# Patient Record
Sex: Female | Born: 1935 | Race: White | Hispanic: No | Smoking: Never smoker
Health system: Southern US, Community
[De-identification: ages and names within clinical notes are randomized; demographics above are authoritative.]

## PROBLEM LIST (undated history)

## (undated) DIAGNOSIS — I10 Essential (primary) hypertension: Secondary | ICD-10-CM

## (undated) DIAGNOSIS — E7800 Pure hypercholesterolemia, unspecified: Secondary | ICD-10-CM

## (undated) DIAGNOSIS — E079 Disorder of thyroid, unspecified: Secondary | ICD-10-CM

## (undated) DIAGNOSIS — F329 Major depressive disorder, single episode, unspecified: Secondary | ICD-10-CM

## (undated) DIAGNOSIS — F32A Depression, unspecified: Secondary | ICD-10-CM

---

## 2017-10-04 ENCOUNTER — Ambulatory Visit (HOSPITAL_COMMUNITY)
Admission: EM | Admit: 2017-10-04 | Discharge: 2017-10-04 | Disposition: A | Discharge status: Home / Self Care | Payer: Medicare FFS | Attending: Internal Medicine | Admitting: Internal Medicine

## 2017-10-04 ENCOUNTER — Encounter (HOSPITAL_COMMUNITY): Payer: Self-pay | Admitting: Family Medicine

## 2017-10-04 ENCOUNTER — Ambulatory Visit (INDEPENDENT_AMBULATORY_CARE_PROVIDER_SITE_OTHER): Payer: Medicare FFS

## 2017-10-04 DIAGNOSIS — H10021 Other mucopurulent conjunctivitis, right eye: Secondary | ICD-10-CM | POA: Diagnosis not present

## 2017-10-04 DIAGNOSIS — J189 Pneumonia, unspecified organism: Secondary | ICD-10-CM

## 2017-10-04 DIAGNOSIS — E876 Hypokalemia: Secondary | ICD-10-CM

## 2017-10-04 DIAGNOSIS — J181 Lobar pneumonia, unspecified organism: Secondary | ICD-10-CM

## 2017-10-04 DIAGNOSIS — H1033 Unspecified acute conjunctivitis, bilateral: Secondary | ICD-10-CM

## 2017-10-04 DIAGNOSIS — R05 Cough: Secondary | ICD-10-CM

## 2017-10-04 HISTORY — DX: Depression, unspecified: F32.A

## 2017-10-04 HISTORY — DX: Disorder of thyroid, unspecified: E07.9

## 2017-10-04 HISTORY — DX: Pure hypercholesterolemia, unspecified: E78.00

## 2017-10-04 HISTORY — DX: Major depressive disorder, single episode, unspecified: F32.9

## 2017-10-04 HISTORY — DX: Essential (primary) hypertension: I10

## 2017-10-04 LAB — POCT I-STAT, CHEM 8
BUN: 13 mg/dL (ref 6–20)
CHLORIDE: 100 mmol/L — AB (ref 101–111)
CREATININE: 0.4 mg/dL — AB (ref 0.44–1.00)
Calcium, Ion: 1.09 mmol/L — ABNORMAL LOW (ref 1.15–1.40)
Glucose, Bld: 159 mg/dL — ABNORMAL HIGH (ref 65–99)
HEMATOCRIT: 39 % (ref 36.0–46.0)
Hemoglobin: 13.3 g/dL (ref 12.0–15.0)
Potassium: 2.8 mmol/L — ABNORMAL LOW (ref 3.5–5.1)
SODIUM: 140 mmol/L (ref 135–145)
TCO2: 28 mmol/L (ref 22–32)

## 2017-10-04 MED ORDER — POTASSIUM CHLORIDE CRYS ER 20 MEQ PO TBCR: 20.0000 meq | EXTENDED_RELEASE_TABLET | Freq: Every day | ORAL | 0 refills | Status: DC

## 2017-10-04 MED ORDER — POLYMYXIN B-TRIMETHOPRIM 10000-0.1 UNIT/ML-% OP SOLN: 1.0000 [drp] | OPHTHALMIC | 0 refills | Status: AC

## 2017-10-04 MED ORDER — LIDOCAINE HCL (PF) 1 % IJ SOLN
INTRAMUSCULAR | Status: AC
  Filled 2017-10-04: qty 2

## 2017-10-04 MED ORDER — LEVOFLOXACIN 750 MG PO TABS: 750.0000 mg | ORAL_TABLET | Freq: Every day | ORAL | 0 refills | Status: AC

## 2017-10-04 MED ORDER — CEFTRIAXONE SODIUM 1 G IJ SOLR
INTRAMUSCULAR | Status: AC
  Filled 2017-10-04: qty 10

## 2017-10-04 MED ORDER — CEFTRIAXONE SODIUM 1 G IJ SOLR
1.0000 g | Freq: Once | INTRAMUSCULAR | Status: AC
  Administered 2017-10-04: 1 g via INTRAMUSCULAR

## 2017-10-04 MED ORDER — LIDOCAINE HCL (PF) 2 % IJ SOLN
INTRAMUSCULAR | Status: AC
  Filled 2017-10-04: qty 2

## 2017-10-04 MED ORDER — POTASSIUM CHLORIDE CRYS ER 20 MEQ PO TBCR: 40.0000 meq | EXTENDED_RELEASE_TABLET | Freq: Every day | ORAL | 0 refills | Status: DC

## 2017-10-04 NOTE — ED Provider Notes (Signed)
[facility]    CSN: [id number] Arrival date & time: 10/04/17  1020     History   Chief Complaint Chief Complaint  Patient presents with  . Cough  . Wheezing    HPI Kaitlin Shelton is a 83 y.o. female.   Kaitlin Shelton presents with her daughters with complaints of cough, wheezing and shortness of breath which has worsened over the past 4-5 days. Productive cough. No known fevers. Without change in mental status. Grandson had ear infection otherwise without other known ill contacts. Without nausea, vomiting or diarrhea. She is here visiting from out of state. Without kidney or cardiac history. History of bronchitis, CVA, aneurysm, left shoulder fracture and left knee pain. Is returning home in two days. Does have a primary care provider. Without chest pain, ear or throat pain.    ROS per HPI.       Past Medical History:  Diagnosis Date  . Depression   . High cholesterol   . Hypertension   . Thyroid disease     There are no active problems to display for this patient.   History reviewed. No pertinent surgical history.  OB History    No data available       Home Medications    Prior to Admission medications   Medication Sig Start Date End Date Taking? Authorizing Provider  aspirin EC 81 MG tablet Take 81 mg by mouth daily.   Yes [provider]  diltiazem (CARDIZEM) 120 MG tablet Take 120 mg by mouth 4 (four) times daily.   Yes [provider]  DULoxetine (CYMBALTA) 30 MG capsule Take 30 mg by mouth daily.   Yes [provider]  folic acid (FOLVITE) 1 MG tablet Take 1 mg by mouth daily.   Yes [provider]  hydrochlorothiazide (HYDRODIURIL) 12.5 MG tablet Take 12.5 mg by mouth daily.   Yes [provider]  levothyroxine (SYNTHROID, LEVOTHROID) 125 MCG tablet Take 125 mcg by mouth daily before breakfast.   Yes [provider]  omeprazole (PRILOSEC) 20 MG capsule Take 20 mg by mouth daily.   Yes  [provider]  pravastatin (PRAVACHOL) 20 MG tablet Take 20 mg by mouth daily.   Yes [provider]  levofloxacin (LEVAQUIN) 750 MG tablet Take 1 tablet (750 mg total) by mouth daily for 5 days. 10/04/17 10/09/17  Georgetta Haber, NP  potassium chloride SA (K-DUR,KLOR-CON) 20 MEQ tablet Take 1 tablet (20 mEq total) by mouth daily for 5 doses. 10/04/17 10/09/17  Georgetta Haber, NP  trimethoprim-polymyxin b (POLYTRIM) ophthalmic solution Place 1 drop into the right eye every 4 (four) hours for 5 days. 10/04/17 10/09/17  Georgetta Haber, NP    Family History History reviewed. No pertinent family history.  Social History Social History   Tobacco Use  . Smoking status: Never Smoker  . Smokeless tobacco: Never Used  Substance Use Topics  . Alcohol use: Not on file  . Drug use: Not on file     Allergies   Codeine   Review of Systems Review of Systems   Physical Exam Triage Vital Signs ED Triage Vitals [10/04/17 1056]  Enc Vitals Group     BP (!) 146/85     Pulse Rate (!) 110     Resp (!) 26     Temp 98.8 F (37.1 C)     Temp src      SpO2 95 %     Weight  Height      Head Circumference      Peak Flow      Pain Score      Pain Loc      Pain Edu?      Excl. in GC?    No data found.  Updated Vital Signs BP (!) 146/85   Pulse (!) 110   Temp 98.8 F (37.1 C)   Resp (!) 26   SpO2 95%   Visual Acuity Right Eye Distance:   Left Eye Distance:   Bilateral Distance:    Right Eye Near:   Left Eye Near:    Bilateral Near:     Physical Exam  Constitutional: She is oriented to person, place, and time. She appears well-developed and well-nourished. No distress.  HENT:  Head: Normocephalic and atraumatic.  Right Ear: Hearing, tympanic membrane, external ear and ear canal normal.  Left Ear: Hearing, tympanic membrane, external ear and ear canal normal.  Nose: Nose normal.  Mouth/Throat: Uvula is midline, oropharynx is clear and moist and  mucous membranes are normal.  Eyes: Right eye exhibits discharge. Left eye exhibits discharge.  Mild bilateral eye redness  Cardiovascular: Normal rate, regular rhythm and normal heart sounds.  Pulmonary/Chest: Effort normal. No accessory muscle usage. Tachypnea noted. No apnea. She has decreased breath sounds in the right upper field and the right lower field.  Neurological: She is alert and oriented to person, place, and time.  Skin: Skin is warm and dry.     UC Treatments / Results  Labs (all labs ordered are listed, but only abnormal results are displayed) Labs Reviewed  POCT I-STAT, CHEM 8 - Abnormal; Notable for the following components:      Result Value   Potassium 2.8 (*)    Chloride 100 (*)    Creatinine, Ser 0.40 (*)    Glucose, Bld 159 (*)    Calcium, Ion 1.09 (*)    All other components within normal limits    EKG  EKG Interpretation None       Radiology Dg Chest 2 View  Result Date: 10/04/2017 CLINICAL DATA:  Cough, shortness of breath EXAM: CHEST  2 VIEW COMPARISON:  None. FINDINGS: Large hiatal hernia.  Associated bilateral lower lobe atelectasis. Possible mild patchy right upper lobe opacity, pneumonia not excluded. No pleural effusion or pneumothorax. Mild degenerative changes of the visualized thoracolumbar spine. IMPRESSION: Possible mild patchy right upper lobe opacity, pneumonia not excluded. Consider follow-up chest radiographs in 4-6 weeks. Large hiatal hernia with bilateral lower lobe atelectasis. Electronically Signed   By: Charline BillsSriyesh  Krishnan M.D.   On: 10/04/2017 11:41    Procedures Procedures (including critical care time)  Medications Ordered in UC Medications  cefTRIAXone (ROCEPHIN) injection 1 g (not administered)     Initial Impression / Assessment and Plan / UC Course  I have reviewed the triage vital signs and the nursing notes.  Pertinent labs & imaging results that were available during my care of the patient were reviewed by me and  considered in my medical decision making (see chart for details).  Clinical Course as of Oct 04 1217  Fri Oct 04, 2017  1148 DG Chest 2 View [NB]    Clinical Course User Index [NB] Georgetta HaberBurky, Natalie B, NP    Noted patient vitals signs. BUN Wnl at this time. Without change in mental status. Patient is staying with family with close observation. Discussed return precautions at length. Potassium replacement provided, with recheck recommended with PCP, daughters thought  she would be able to be reseen on 12/31 with her PCP. Rocephin in clinic and levaquin provided today. Emphasized return precautions and recheck with PCP to ensure improvement. Patient and daughters verbalized understanding and agreeable to plan.    Final Clinical Impressions(s) / UC Diagnoses   Final diagnoses:  Community acquired pneumonia of right upper lobe of lung (HCC)  Acute conjunctivitis of both eyes, unspecified acute conjunctivitis type  Hypokalemia    ED Discharge Orders        Ordered    trimethoprim-polymyxin b (POLYTRIM) ophthalmic solution  Every 4 hours     10/04/17 1149    levofloxacin (LEVAQUIN) 750 MG tablet  Daily     10/04/17 1214    potassium chloride SA (K-DUR,KLOR-CON) 20 MEQ tablet  Daily,   Status:  Discontinued     10/04/17 1214    potassium chloride SA (K-DUR,KLOR-CON) 20 MEQ tablet  Daily    Comments:  Please disregard previous order for 40meq daily   10/04/17 1216       Controlled Substance Prescriptions [place] Controlled Substance Registry consulted? Not Applicable   Georgetta HaberBurky, Natalie B, NP 10/04/17 1221

## 2017-10-04 NOTE — ED Triage Notes (Signed)
Pt here for 4 days of cough and wheezing with SOB.

## 2017-10-04 NOTE — Discharge Instructions (Signed)
12/28 1158 Sodium 140  Potassium 2.8  Chloride 100  BUN 13  Creatinine 0.40  Glucose 159  Calcium Ionized 1.09  TCO2 28  Hemoglobin 13.3  HCT 39.0  CLINICAL DATA:  Cough, shortness of breath   EXAM: CHEST  2 VIEW   COMPARISON:  None.   FINDINGS: Large hiatal hernia.  Associated bilateral lower lobe atelectasis.   Possible mild patchy right upper lobe opacity, pneumonia not excluded.   No pleural effusion or pneumothorax.   Mild degenerative changes of the visualized thoracolumbar spine.   IMPRESSION: Possible mild patchy right upper lobe opacity, pneumonia not excluded. Consider follow-up chest radiographs in 4-6 weeks.   Large hiatal hernia with bilateral lower lobe atelectasis.     Electronically Signed   By: Charline BillsSriyesh  Krishnan M.D.   On: 10/04/2017 11:41\  Please take daily potassium and recheck with primary care doctor in the next week. Complete course of antibiotics for pneumonia. Small frequent sips of water to ensure adequate hydration. Eye drops to bilateral eyes.  If develop change in mentation, more fatigue, confusion, weakness, fevers develop, or otherwise worsening please go to the er for further treatment. Please recheck with your pcp to ensure improving on Monday when you arrive home or at soonest available appointment.

## 2021-03-01 ENCOUNTER — Other Ambulatory Visit: Payer: Self-pay

## 2021-03-01 ENCOUNTER — Encounter: Payer: Self-pay | Admitting: Pulmonary Disease

## 2021-03-01 ENCOUNTER — Ambulatory Visit (INDEPENDENT_AMBULATORY_CARE_PROVIDER_SITE_OTHER): Payer: Medicare Other | Admitting: Pulmonary Disease

## 2021-03-01 ENCOUNTER — Ambulatory Visit (INDEPENDENT_AMBULATORY_CARE_PROVIDER_SITE_OTHER): Payer: Medicare Other

## 2021-03-01 VITALS — BP 122/74 | HR 73 | Temp 97.3°F | Ht 64.0 in | Wt 196.0 lb

## 2021-03-01 DIAGNOSIS — R059 Cough, unspecified: Secondary | ICD-10-CM | POA: Diagnosis not present

## 2021-03-01 DIAGNOSIS — R0602 Shortness of breath: Secondary | ICD-10-CM | POA: Diagnosis not present

## 2021-03-01 NOTE — Patient Instructions (Signed)
Continue using oxygen supplementation at night  Chest x-ray today  Nebulization treatment Inhalers as needed  Graded exercises as tolerated  Call with any significant concerns  I will see you back in 4 months

## 2021-03-01 NOTE — Progress Notes (Signed)
Kaitlin Shelton    [id number]    1929-07-07  Primary Care Physician:Pcp, No  Referring Physician: No referring provider defined for this encounter.  Chief complaint:   Patient being seen for shortness of breath  HPI: Accompanied by her daughter Recently moved to [place] Lives in assisted living  Has a past history of obstructive sleep apnea Not able to tolerate CPAP therapy She uses oxygen supplementation 2 to 4 L at night  Shortness of breath on exertion No cough No chest pains or chest discomfort  Has a history of hiatal hernia Stroke 12 years ago History of dementia  Patient has no complaints today, states she feels well  Outpatient Encounter Medications as of 03/01/2021  Medication Sig  . albuterol (VENTOLIN HFA) 108 (90 Base) MCG/ACT inhaler ProAir HFA 90 mcg/actuation aerosol inhaler  . arformoterol (BROVANA) 15 MCG/2ML NEBU Brovana 15 mcg/2 mL solution for nebulization  Inhale 2 mL twice a day by inhalation route for 30 days.  Marland Kitchen aspirin EC 81 MG tablet Take 81 mg by mouth daily.  . benzonatate (TESSALON) 100 MG capsule benzonatate 100 mg capsule  . diltiazem (CARDIZEM CD) 180 MG 24 hr capsule Cartia XT 180 mg capsule,extended release  . diltiazem (CARDIZEM) 120 MG tablet Take 120 mg by mouth 4 (four) times daily.  . DULoxetine (CYMBALTA) 30 MG capsule Take 30 mg by mouth daily.  . folic acid (FOLVITE) 1 MG tablet Take 1 mg by mouth daily.  . hydrochlorothiazide (HYDRODIURIL) 12.5 MG tablet Take 12.5 mg by mouth daily.  Marland Kitchen ipratropium-albuterol (DUONEB) 0.5-2.5 (3) MG/3ML SOLN ipratropium 0.5 mg-albuterol 3 mg (2.5 mg base)/3 mL nebulization soln  USE 1 VIAL IN NEBULIZER EVERY 4 TO 6 HOURS AS NEEDED  . levothyroxine (SYNTHROID, LEVOTHROID) 125 MCG tablet Take 125 mcg by mouth daily before breakfast.  . omeprazole (PRILOSEC) 20 MG capsule Take 20 mg by mouth daily.  . pravastatin (PRAVACHOL) 20 MG tablet Take 20 mg by mouth daily.  . predniSONE  (DELTASONE) 20 MG tablet prednisone 20 mg tablet  . [DISCONTINUED] potassium chloride SA (K-DUR,KLOR-CON) 20 MEQ tablet Take 1 tablet (20 mEq total) by mouth daily for 5 doses.   No facility-administered encounter medications on file as of 03/01/2021.    Allergies as of 03/01/2021 - Review Complete 03/01/2021  Allergen Reaction Noted  . Codeine  10/04/2017    Past Medical History:  Diagnosis Date  . Depression   . High cholesterol   . Hypertension   . Thyroid disease     History reviewed. No pertinent surgical history.  History reviewed. No pertinent family history.  Social History   Socioeconomic History  . Marital status: Widowed    Spouse name: Not on file  . Number of children: Not on file  . Years of education: Not on file  . Highest education level: Not on file  Occupational History  . Not on file  Tobacco Use  . Smoking status: Never Smoker  . Smokeless tobacco: Never Used  Substance and Sexual Activity  . Alcohol use: Not on file  . Drug use: Not on file  . Sexual activity: Not on file  Other Topics Concern  . Not on file  Social History Narrative  . Not on file   Social Determinants of Health   Financial Resource Strain: Not on file  Food Insecurity: Not on file  Transportation Needs: Not on file  Physical Activity: Not on file  Stress: Not on  file  Social Connections: Not on file  Intimate Partner Violence: Not on file    Review of Systems  Respiratory: Negative for cough and shortness of breath.   Psychiatric/Behavioral: Negative for sleep disturbance.    Vitals:   03/01/21 1518  BP: 122/74  Pulse: 73  Temp: (!) 97.3 F (36.3 C)  SpO2: 94%     Physical Exam Constitutional:      Appearance: She is obese.  HENT:     Head: Normocephalic and atraumatic.     Mouth/Throat:     Mouth: Mucous membranes are moist.  Eyes:     General:        Right eye: No discharge.        Left eye: No discharge.  Cardiovascular:     Rate and Rhythm:  Normal rate and regular rhythm.     Heart sounds: No murmur heard. No friction rub.  Pulmonary:     Effort: No respiratory distress.     Breath sounds: No stridor. No wheezing or rhonchi.     Comments: Decreased air movement at the left base Musculoskeletal:     Cervical back: No rigidity or tenderness.  Neurological:     Mental Status: She is alert.  Psychiatric:        Mood and Affect: Mood normal.     Assessment:  History of obstructive sleep apnea -Only able to tolerate oxygen supplementation at night -On 2 to 4 L of oxygen at night  Shortness of breath -She does have a nebulizer that she uses as needed -MDI as needed  Plan/Recommendations: Obtain chest x-ray today  Continue bronchodilators as needed  Encouraged to call as needed  I will see her back in about 4 months  I did review the results chest x-ray performed today showing a large hiatal hernia, no acute infiltrative process in the lungs    Virl Diamond MD [facility] 03/01/2021, 3:25 PM  CC: No ref. provider found

## 2021-03-23 ENCOUNTER — Encounter (HOSPITAL_COMMUNITY): Payer: Self-pay | Admitting: Emergency Medicine

## 2021-03-23 ENCOUNTER — Other Ambulatory Visit: Payer: Self-pay

## 2021-03-23 ENCOUNTER — Emergency Department (HOSPITAL_COMMUNITY)
Admission: EM | Admit: 2021-03-23 | Discharge: 2021-03-23 | Disposition: A | Discharge status: Home / Self Care | Payer: Medicare Other | Attending: Emergency Medicine | Admitting: Emergency Medicine

## 2021-03-23 ENCOUNTER — Emergency Department (HOSPITAL_COMMUNITY): Payer: Medicare Other

## 2021-03-23 DIAGNOSIS — S32020A Wedge compression fracture of second lumbar vertebra, initial encounter for closed fracture: Secondary | ICD-10-CM | POA: Diagnosis not present

## 2021-03-23 DIAGNOSIS — W19XXXA Unspecified fall, initial encounter: Secondary | ICD-10-CM | POA: Insufficient documentation

## 2021-03-23 DIAGNOSIS — Z7982 Long term (current) use of aspirin: Secondary | ICD-10-CM | POA: Diagnosis not present

## 2021-03-23 DIAGNOSIS — F0390 Unspecified dementia without behavioral disturbance: Secondary | ICD-10-CM | POA: Insufficient documentation

## 2021-03-23 DIAGNOSIS — T1490XA Injury, unspecified, initial encounter: Secondary | ICD-10-CM | POA: Diagnosis present

## 2021-03-23 DIAGNOSIS — I4891 Unspecified atrial fibrillation: Secondary | ICD-10-CM

## 2021-03-23 DIAGNOSIS — Z79899 Other long term (current) drug therapy: Secondary | ICD-10-CM | POA: Diagnosis not present

## 2021-03-23 DIAGNOSIS — M25512 Pain in left shoulder: Secondary | ICD-10-CM | POA: Insufficient documentation

## 2021-03-23 DIAGNOSIS — I10 Essential (primary) hypertension: Secondary | ICD-10-CM | POA: Diagnosis not present

## 2021-03-23 LAB — CBC WITH DIFFERENTIAL/PLATELET
Abs Immature Granulocytes: 0.06 10*3/uL (ref 0.00–0.07)
Basophils Absolute: 0 10*3/uL (ref 0.0–0.1)
Basophils Relative: 0 %
Eosinophils Absolute: 0 10*3/uL (ref 0.0–0.5)
Eosinophils Relative: 0 %
HCT: 38.6 % (ref 36.0–46.0)
Hemoglobin: 12.3 g/dL (ref 12.0–15.0)
Immature Granulocytes: 1 %
Lymphocytes Relative: 8 %
Lymphs Abs: 0.6 10*3/uL — ABNORMAL LOW (ref 0.7–4.0)
MCH: 31.5 pg (ref 26.0–34.0)
MCHC: 31.9 g/dL (ref 30.0–36.0)
MCV: 99 fL (ref 80.0–100.0)
Monocytes Absolute: 0.6 10*3/uL (ref 0.1–1.0)
Monocytes Relative: 8 %
Neutro Abs: 6.9 10*3/uL (ref 1.7–7.7)
Neutrophils Relative %: 83 %
Platelets: 159 10*3/uL (ref 150–400)
RBC: 3.9 MIL/uL (ref 3.87–5.11)
RDW: 13.8 % (ref 11.5–15.5)
WBC: 8.3 10*3/uL (ref 4.0–10.5)
nRBC: 0 % (ref 0.0–0.2)

## 2021-03-23 LAB — BASIC METABOLIC PANEL
Anion gap: 9 (ref 5–15)
BUN: 16 mg/dL (ref 8–23)
CO2: 29 mmol/L (ref 22–32)
Calcium: 8.9 mg/dL (ref 8.9–10.3)
Chloride: 103 mmol/L (ref 98–111)
Creatinine, Ser: 0.49 mg/dL (ref 0.44–1.00)
GFR, Estimated: >60 mL/min (ref >60)
Glucose, Bld: 124 mg/dL — ABNORMAL HIGH (ref 70–99)
Potassium: 3.7 mmol/L (ref 3.5–5.1)
Sodium: 141 mmol/L (ref 135–145)

## 2021-03-23 MED ORDER — ACETAMINOPHEN 500 MG PO TABS
1000.0000 mg | ORAL_TABLET | Freq: Once | ORAL | Status: AC
  Administered 2021-03-23: 1000 mg via ORAL
  Filled 2021-03-23: qty 2

## 2021-03-23 MED ORDER — DILTIAZEM HCL ER COATED BEADS 180 MG PO CP24
180.0000 mg | ORAL_CAPSULE | Freq: Once | ORAL | Status: AC
  Administered 2021-03-23: 180 mg via ORAL
  Filled 2021-03-23: qty 1

## 2021-03-23 MED ORDER — DILTIAZEM HCL 25 MG/5ML IV SOLN
20.0000 mg | Freq: Once | INTRAVENOUS | Status: AC
  Administered 2021-03-23: 20 mg via INTRAVENOUS
  Filled 2021-03-23: qty 5

## 2021-03-23 MED ORDER — NYSTATIN 100000 UNIT/GM EX CREA
TOPICAL_CREAM | Freq: Once | CUTANEOUS | Status: AC
  Filled 2021-03-23: qty 15

## 2021-03-23 NOTE — ED Notes (Signed)
Pt in CT.

## 2021-03-23 NOTE — ED Provider Notes (Signed)
[facility]-EMERGENCY DEPT Provider Note   CSN: [id number] Arrival date & time: 03/23/21  1002     History Chief Complaint  Patient presents with   Kaitlin Shelton is a 87 y.o. female.  Pt presents to the ED today from [facility].  She had an unwitnessed fall in her room.  She does not complain of any pain to me, but complained of left shoulder pain to EMS.  This pain is chronic per EMS report.  Pt is not on a blood thinner.  She has dementia and is unable to give any hx.   Pt noted to be in afib with RVR.  She is supposed to be on diltiazem, but has not had any of her meds today.  Daughter said she is not supposed to walk on her own.  She walks very little.  Most of the time she is transported via wheelchair, but she has a walker.      Past Medical History:  Diagnosis Date   Depression    High cholesterol    Hypertension    Thyroid disease     There are no problems to display for this patient.   No past surgical history on file.   OB History   No obstetric history on file.     No family history on file.  Social History   Tobacco Use   Smoking status: Never   Smokeless tobacco: Never    Home Medications Prior to Admission medications   Medication Sig Start Date End Date Taking? Authorizing Provider  albuterol (VENTOLIN HFA) 108 (90 Base) MCG/ACT inhaler ProAir HFA 90 mcg/actuation aerosol inhaler    [provider]  arformoterol (BROVANA) 15 MCG/2ML NEBU Brovana 15 mcg/2 mL solution for nebulization  Inhale 2 mL twice a day by inhalation route for 30 days.    [provider]  aspirin EC 81 MG tablet Take 81 mg by mouth daily.    [provider]  benzonatate (TESSALON) 100 MG capsule benzonatate 100 mg capsule    [provider]  diltiazem (CARDIZEM CD) 180 MG 24 hr capsule Cartia XT 180 mg capsule,extended release 04/25/20   [provider]  diltiazem (CARDIZEM) 120 MG  tablet Take 120 mg by mouth 4 (four) times daily.    [provider]  DULoxetine (CYMBALTA) 30 MG capsule Take 30 mg by mouth daily.    [provider]  folic acid (FOLVITE) 1 MG tablet Take 1 mg by mouth daily.    [provider]  hydrochlorothiazide (HYDRODIURIL) 12.5 MG tablet Take 12.5 mg by mouth daily.    [provider]  ipratropium-albuterol (DUONEB) 0.5-2.5 (3) MG/3ML SOLN ipratropium 0.5 mg-albuterol 3 mg (2.5 mg base)/3 mL nebulization soln  USE 1 VIAL IN NEBULIZER EVERY 4 TO 6 HOURS AS NEEDED    [provider]  levothyroxine (SYNTHROID, LEVOTHROID) 125 MCG tablet Take 125 mcg by mouth daily before breakfast.    [provider]  omeprazole (PRILOSEC) 20 MG capsule Take 20 mg by mouth daily.    [provider]  pravastatin (PRAVACHOL) 20 MG tablet Take 20 mg by mouth daily.    [provider]  predniSONE (DELTASONE) 20 MG tablet prednisone 20 mg tablet    [provider]    Allergies    Codeine  Review of Systems   Review of Systems  Unable to perform ROS: Dementia  All other systems reviewed and are negative.  Physical  Exam Updated Vital Signs BP 123/64   Pulse 85   Temp 97.8 F (36.6 C) (Oral)   Resp 19   Ht 5\' 4"  (1.626 m)   Wt 88.9 kg   SpO2 93%   BMI 33.64 kg/m   Physical Exam Vitals and nursing note reviewed.  Constitutional:      Appearance: Normal appearance.  HENT:     Head: Normocephalic and atraumatic.     Right Ear: External ear normal.     Left Ear: External ear normal.     Nose: Nose normal.     Mouth/Throat:     Mouth: Mucous membranes are moist.     Pharynx: Oropharynx is clear.  Eyes:     Extraocular Movements: Extraocular movements intact.     Conjunctiva/sclera: Conjunctivae normal.     Pupils: Pupils are equal, round, and reactive to light.  Cardiovascular:     Rate and Rhythm: Tachycardia present. Rhythm irregular.     Pulses: Normal pulses.     Heart  sounds: Normal heart sounds.  Pulmonary:     Effort: Pulmonary effort is normal.     Breath sounds: Normal breath sounds.  Abdominal:     General: Abdomen is flat. Bowel sounds are normal.     Palpations: Abdomen is soft.  Musculoskeletal:        General: Normal range of motion.     Cervical back: Normal range of motion and neck supple.  Skin:    Capillary Refill: Capillary refill takes less than 2 seconds.     Comments: Intertrigo right groin  Neurological:     General: No focal deficit present.     Mental Status: She is alert. Mental status is at baseline.  Psychiatric:        Mood and Affect: Mood normal.   ED Results / Procedures / Treatments   Labs (all labs ordered are listed, but only abnormal results are displayed) Labs Reviewed  BASIC METABOLIC PANEL - Abnormal; Notable for the following components:      Result Value   Glucose, Bld 124 (*)    All other components within normal limits  CBC WITH DIFFERENTIAL/PLATELET - Abnormal; Notable for the following components:   Lymphs Abs 0.6 (*)    All other components within normal limits    EKG EKG Interpretation  Date/Time:  Thursday March 23 2021 10:18:28 EDT Ventricular Rate:  127 PR Interval:  142 QRS Duration: 83 QT Interval:  334 QTC Calculation: 490 R Axis:   75 Text Interpretation: Ectopic atrial tachycardia, unifocal Abnormal R-wave progression, early transition Inferior infarct, old No old tracing to compare Confirmed by [id number] [phone]) on 03/23/2021 11:17:33 AM  Radiology No results found.  Procedures Procedures   Medications Ordered in ED Medications  nystatin cream (MYCOSTATIN) ( Topical Given 03/23/21 1124)  diltiazem (CARDIZEM CD) 24 hr capsule 180 mg (180 mg Oral Given 03/23/21 1232)  diltiazem (CARDIZEM) injection 20 mg (20 mg Intravenous Given 03/23/21 1528)  acetaminophen (TYLENOL) tablet 1,000 mg (1,000 mg Oral Given 03/23/21 1454)    ED Course  I have reviewed the triage vital signs  and the nursing notes.  Pertinent labs & imaging results that were available during my care of the patient were reviewed by me and considered in my medical decision making (see chart for details).    MDM Rules/Calculators/A&P  HR is down with cardizem.  Pt has afib, but is not on blood thinners.  Likely due to fall risk.  CHA2DS2/VAS Stroke Risk Points     51 (age, sex, htn, cva)    This score determines the patient's risk of having a stroke if the  patient has atrial fibrillation.      This score is not applicable to this patient. Components are not  calculated.      Pt's daughter verifies the fx seen (Pelvis and Arm) were old.  Nurses tried to get her up and she complained of back pain.   Urine still pending. Pt signed out to Dr. Criss Alvine at shift change.  Final Clinical Impression(s) / ED Diagnoses Final diagnoses:  Fall, initial encounter  Atrial fibrillation with RVR (HCC)  Closed compression fracture of L2 lumbar vertebra, initial encounter [facility])    Rx / DC Orders ED Discharge Orders     None        Jacalyn Lefevre, MD 03/27/21 [phone]

## 2021-03-23 NOTE — ED Notes (Signed)
Pt in CT at this time.

## 2021-03-23 NOTE — ED Triage Notes (Signed)
Pt arrived by [facility] from [place] [place] with c/o unwitnessed fall in room. Pt does complain of left shoulder pain, however daughter reports the left arm pain is not anything new. Pt has hx of dementia unable to provide other details. Denies use of blood thinner.   Vitals 126/76 HR - 80 RR- 22 95 % 2LNC CBG 135

## 2021-03-23 NOTE — Discharge Instructions (Addendum)
If you develop worsening, recurrent, or continued back pain, numbness or weakness in the legs, incontinence of your bowels or bladders, numbness of your buttocks, fever, abdominal pain, or any other new/concerning symptoms then return to the ER for evaluation.  

## 2021-03-23 NOTE — ED Provider Notes (Signed)
Care transferred to me.  X-ray shows L2 compression fracture of unclear age.  After the Tylenol the patient is able to get up and ambulate with assistance.  This seems to be her baseline.  Discussed with daughter and at this point given it is taken a while for the patient to give a urine sample she would prefer to just have a urine checked at her facility.  I think this is pretty reasonable.  She was tachycardic earlier in the presentation but right now her vitals are normal.  Will discharge home with return precautions.   Pricilla Loveless, MD 03/23/21 1729

## 2021-03-23 NOTE — ED Notes (Signed)
Pt cannot attempt to even get up without having severe pain. Was not able to ambulate.

## 2021-04-12 ENCOUNTER — Telehealth: Payer: Self-pay | Admitting: Pulmonary Disease

## 2021-04-12 DIAGNOSIS — R0602 Shortness of breath: Secondary | ICD-10-CM

## 2021-04-12 NOTE — Telephone Encounter (Signed)
Call returned to patient daughter Olegario Messier, she states her mother moved from [place], [place]. She reports she has been here since March and has been using [facility] [facility] and they now need a local oxygen company. Her concentrator broke and they are willing to get her one until she can get with a new company. Patient is only using the concentrator at night because she has sleep apnea. If they check her oxygen during the day and it is low which is rare per the daughter she will use it during the day. They cannot recall when she had a sleep study but states he has been forever ago. I made her aware I would have to find out more information and give her a call back.   Called made to [facility]. They do not have a sleep study or ONO on file for this patient.   Will send message to MD.   AO please advise if okay to place order for ONO. Patient needs a local oxygen company however previous pulmonary office does not have a recent ONO and qualifying stats for oxygen are 87 years old. Patient only uses oxygen at night. Thanks :)

## 2021-04-14 ENCOUNTER — Ambulatory Visit: Payer: Self-pay

## 2021-04-23 ENCOUNTER — Emergency Department (HOSPITAL_BASED_OUTPATIENT_CLINIC_OR_DEPARTMENT_OTHER): Payer: Medicare Other

## 2021-04-23 ENCOUNTER — Emergency Department (HOSPITAL_BASED_OUTPATIENT_CLINIC_OR_DEPARTMENT_OTHER)
Admission: EM | Admit: 2021-04-23 | Discharge: 2021-04-23 | Disposition: A | Discharge status: Home / Self Care | Payer: Medicare Other | Attending: Emergency Medicine | Admitting: Emergency Medicine

## 2021-04-23 ENCOUNTER — Encounter (HOSPITAL_BASED_OUTPATIENT_CLINIC_OR_DEPARTMENT_OTHER): Payer: Self-pay

## 2021-04-23 DIAGNOSIS — Y9289 Other specified places as the place of occurrence of the external cause: Secondary | ICD-10-CM | POA: Insufficient documentation

## 2021-04-23 DIAGNOSIS — Z79899 Other long term (current) drug therapy: Secondary | ICD-10-CM | POA: Insufficient documentation

## 2021-04-23 DIAGNOSIS — I10 Essential (primary) hypertension: Secondary | ICD-10-CM | POA: Diagnosis not present

## 2021-04-23 DIAGNOSIS — M79602 Pain in left arm: Secondary | ICD-10-CM

## 2021-04-23 DIAGNOSIS — Z7982 Long term (current) use of aspirin: Secondary | ICD-10-CM | POA: Diagnosis not present

## 2021-04-23 DIAGNOSIS — W19XXXA Unspecified fall, initial encounter: Secondary | ICD-10-CM | POA: Diagnosis not present

## 2021-04-23 DIAGNOSIS — F0390 Unspecified dementia without behavioral disturbance: Secondary | ICD-10-CM | POA: Insufficient documentation

## 2021-04-23 NOTE — ED Triage Notes (Signed)
Ss found by staff at [facility] on her floor, having fallen. Pain response elicited at left wrist and shoulder areas (only). She is alert and confused per her normal per staff at the nursing home.

## 2021-04-23 NOTE — ED Provider Notes (Signed)
[facility] EMERGENCY DEPT Provider Note   CSN: [id number] Arrival date & time: 04/23/21  0076     History No chief complaint on file.   Kaitlin Shelton is a 87 y.o. female.  She is brought in by EMS for evaluation after a fall.  Level 5 caveat secondary to dementia.  Patient resident at [facility].  Does not recall fall.  Complaining of left arm pain.  Unknown if any loss of consciousness.  Found on the floor by staff.  Assisted to standing.  Not on blood thinners.  The history is provided by the patient and the EMS personnel.  Fall This is a new problem. The current episode started 1 to 2 hours ago. The problem has not changed since onset.Pertinent negatives include no chest pain, no abdominal pain, no headaches and no shortness of breath. Associated symptoms comments: L arm pain. The symptoms are aggravated by bending and twisting. The symptoms are relieved by position. She has tried nothing for the symptoms. The treatment provided no relief.      Past Medical History:  Diagnosis Date   Depression    High cholesterol    Hypertension    Thyroid disease     There are no problems to display for this patient.   No past surgical history on file.   OB History   No obstetric history on file.     No family history on file.  Social History   Tobacco Use   Smoking status: Never   Smokeless tobacco: Never    Home Medications Prior to Admission medications   Medication Sig Start Date End Date Taking? Authorizing Provider  albuterol (VENTOLIN HFA) 108 (90 Base) MCG/ACT inhaler ProAir HFA 90 mcg/actuation aerosol inhaler    [provider]  arformoterol (BROVANA) 15 MCG/2ML NEBU Brovana 15 mcg/2 mL solution for nebulization  Inhale 2 mL twice a day by inhalation route for 30 days.    [provider]  aspirin EC 81 MG tablet Take 81 mg by mouth daily.    [provider]  benzonatate (TESSALON) 100 MG capsule benzonatate 100 mg capsule     [provider]  diltiazem (CARDIZEM CD) 180 MG 24 hr capsule Cartia XT 180 mg capsule,extended release 04/25/20   [provider]  diltiazem (CARDIZEM) 120 MG tablet Take 120 mg by mouth 4 (four) times daily.    [provider]  DULoxetine (CYMBALTA) 30 MG capsule Take 30 mg by mouth daily.    [provider]  folic acid (FOLVITE) 1 MG tablet Take 1 mg by mouth daily.    [provider]  hydrochlorothiazide (HYDRODIURIL) 12.5 MG tablet Take 12.5 mg by mouth daily.    [provider]  ipratropium-albuterol (DUONEB) 0.5-2.5 (3) MG/3ML SOLN ipratropium 0.5 mg-albuterol 3 mg (2.5 mg base)/3 mL nebulization soln  USE 1 VIAL IN NEBULIZER EVERY 4 TO 6 HOURS AS NEEDED    [provider]  levothyroxine (SYNTHROID, LEVOTHROID) 125 MCG tablet Take 125 mcg by mouth daily before breakfast.    [provider]  omeprazole (PRILOSEC) 20 MG capsule Take 20 mg by mouth daily.    [provider]  pravastatin (PRAVACHOL) 20 MG tablet Take 20 mg by mouth daily.    [provider]  predniSONE (DELTASONE) 20 MG tablet prednisone 20 mg tablet    [provider]    Allergies    Codeine  Review of Systems   Review of Systems  Unable to perform ROS: Dementia  Respiratory:  Negative for shortness of breath.   Cardiovascular:  Negative for chest pain.  Gastrointestinal:  Negative for abdominal pain.  Neurological:  Negative for headaches.   Physical Exam Updated Vital Signs BP (!) 146/85   Pulse 70   Temp 97.9 F (36.6 C) (Oral)   Resp (!) 21   SpO2 95%   Physical Exam Vitals and nursing note reviewed.  Constitutional:      General: She is not in acute distress.    Appearance: Normal appearance. She is well-developed.  HENT:     Head: Normocephalic and atraumatic.  Eyes:     Conjunctiva/sclera: Conjunctivae normal.  Cardiovascular:     Rate and Rhythm: Normal rate and regular rhythm.     Heart sounds:  No murmur heard. Pulmonary:     Effort: Pulmonary effort is normal. No respiratory distress.     Breath sounds: Normal breath sounds.  Abdominal:     Palpations: Abdomen is soft.     Tenderness: There is no abdominal tenderness. There is no guarding or rebound.  Musculoskeletal:        General: Tenderness present.     Cervical back: Neck supple.     Comments: Right upper extremity and right lower extremity full range of motion and no tenderness to palpation.  Left upper extremity vague tenderness to proximal humerus and possibly forearm.  Distal pulses motor and sensation intact.  Left lower extremity full range of motion although has some hip tenderness.  No shortening or rotation.  Distal pulses and sensation intact.  No cervical thoracic or lumbar tenderness.  Skin:    General: Skin is warm and dry.  Neurological:     General: No focal deficit present.     Mental Status: She is alert. She is disoriented.    ED Results / Procedures / Treatments   Labs (all labs ordered are listed, but only abnormal results are displayed) Labs Reviewed - No data to display  EKG EKG Interpretation  Date/Time:  Sunday April 23 2021 08:34:33 EDT Ventricular Rate:  85 PR Interval:  234 QRS Duration: 90 QT Interval:  406 QTC Calculation: 483 R Axis:   72 Text Interpretation: Sinus rhythm Prolonged PR interval Abnormal R-wave progression, early transition No significant change since last tracing Reconfirmed by Meridee Score [phone]) on 04/23/2021 8:37:48 AM  Radiology DG Chest 1 View  Result Date: 04/23/2021 CLINICAL DATA:  Unwitnessed fall.  Left arm pain EXAM: CHEST  1 VIEW COMPARISON:  03/23/2021 FINDINGS: Very large hiatal hernia. Stable cardiomegaly. Atherosclerotic calcification of the aortic knob. Chronic left apical scarring. Bibasilar atelectasis. No new focal airspace consolidation is seen. Chronic deformity of the proximal left humerus. IMPRESSION: 1. Very large hiatal hernia with bibasilar  atelectasis. No new focal airspace consolidation. 2. Chronic posttraumatic deformity of the left humerus. Electronically Signed   By: Duanne Guess D.O.   On: 04/23/2021 09:49   DG Forearm Left  Result Date: 04/23/2021 CLINICAL DATA:  87 year old female found down. Pain. History of humerus fracture. EXAM: LEFT FOREARM - 2 VIEW COMPARISON:  None. FINDINGS: AP and cross-table lateral views. Alignment appears maintained at the elbow. Distal left humerus appears grossly intact. Alignment appears maintained at the wrist where there is osteopenia. No radius or ulna fracture identified. No discrete soft tissue injury is evident. IMPRESSION: No acute fracture or dislocation identified about the left forearm. Electronically Signed   By: Odessa Fleming M.D.   On: 04/23/2021 09:52   CT Head Wo Contrast  Result Date: 04/23/2021 CLINICAL DATA:  Head and neck trauma EXAM: CT HEAD WITHOUT CONTRAST CT CERVICAL SPINE WITHOUT CONTRAST TECHNIQUE: Multidetector CT imaging of the head and cervical spine was performed following the standard protocol without intravenous contrast. Multiplanar CT image reconstructions of the cervical spine were also generated. COMPARISON:  03/23/2021 FINDINGS: CT HEAD FINDINGS Brain: No evidence of acute infarction, hemorrhage, hydrocephalus, extra-axial collection or mass lesion/mass effect. Sequela of bilateral cerebellar infarcts, left greater than right. Right parietal approach VP shunt catheter remains in place with distal tip terminating at the anterior horn of the left lateral ventricle. Prior left MCA aneurysm coiling. Scattered low-density changes within the periventricular and subcortical white matter compatible with chronic microvascular ischemic change. Mild diffuse cerebral volume loss. Vascular: Atherosclerotic calcifications involving the large vessels of the skull base. No unexpected hyperdense vessel. Skull: Negative for fracture or focal lesion. Sinuses/Orbits: No acute finding. Other:  Negative for scalp hematoma. CT CERVICAL SPINE FINDINGS Alignment: Facet joints are aligned without dislocation or traumatic listhesis. Dens and lateral masses are aligned. Skull base and vertebrae: No acute fracture. No primary bone lesion or focal pathologic process. Chronic appearing nondisplaced fractures of the posterior left second through fifth rib fractures near the costovertebral junctions. Soft tissues and spinal canal: No prevertebral fluid or swelling. No visible canal hematoma. Disc levels: Similar degree of facet predominant multilevel cervical spondylosis, not appreciably progressed from prior. The bilateral C3-4 facet joints are fused. There is a small amount of pannus formation posterior to the odontoid process without significant mass effect at the craniocervical junction. Upper chest: Left apical scarring or atelectasis.  No pneumothorax. Other: Carotid atherosclerosis. IMPRESSION: 1. No acute intracranial findings. 2. No evidence of acute traumatic injury to the cervical spine. 3. Chronic appearing nondisplaced fractures of the posterior left second through fifth rib fractures near the costovertebral junctions. Electronically Signed   By: Duanne Guess D.O.   On: 04/23/2021 09:22   CT Cervical Spine Wo Contrast  Result Date: 04/23/2021 CLINICAL DATA:  Head and neck trauma EXAM: CT HEAD WITHOUT CONTRAST CT CERVICAL SPINE WITHOUT CONTRAST TECHNIQUE: Multidetector CT imaging of the head and cervical spine was performed following the standard protocol without intravenous contrast. Multiplanar CT image reconstructions of the cervical spine were also generated. COMPARISON:  03/23/2021 FINDINGS: CT HEAD FINDINGS Brain: No evidence of acute infarction, hemorrhage, hydrocephalus, extra-axial collection or mass lesion/mass effect. Sequela of bilateral cerebellar infarcts, left greater than right. Right parietal approach VP shunt catheter remains in place with distal tip terminating at the anterior  horn of the left lateral ventricle. Prior left MCA aneurysm coiling. Scattered low-density changes within the periventricular and subcortical white matter compatible with chronic microvascular ischemic change. Mild diffuse cerebral volume loss. Vascular: Atherosclerotic calcifications involving the large vessels of the skull base. No unexpected hyperdense vessel. Skull: Negative for fracture or focal lesion. Sinuses/Orbits: No acute finding. Other: Negative for scalp hematoma. CT CERVICAL SPINE FINDINGS Alignment: Facet joints are aligned without dislocation or traumatic listhesis. Dens and lateral masses are aligned. Skull base and vertebrae: No acute fracture. No primary bone lesion or focal pathologic process. Chronic appearing nondisplaced fractures of the posterior left second through fifth rib fractures near the costovertebral junctions. Soft tissues and spinal canal: No prevertebral fluid or swelling. No visible canal hematoma. Disc levels: Similar degree of facet predominant multilevel cervical spondylosis, not appreciably progressed from prior. The bilateral C3-4 facet joints are fused. There is a small amount of pannus formation posterior to the odontoid process  without significant mass effect at the craniocervical junction. Upper chest: Left apical scarring or atelectasis.  No pneumothorax. Other: Carotid atherosclerosis. IMPRESSION: 1. No acute intracranial findings. 2. No evidence of acute traumatic injury to the cervical spine. 3. Chronic appearing nondisplaced fractures of the posterior left second through fifth rib fractures near the costovertebral junctions. Electronically Signed   By: Duanne GuessNicholas  Plundo D.O.   On: 04/23/2021 09:22   DG Shoulder Left  Result Date: 04/23/2021 CLINICAL DATA:  87 year old female found down. Pain. History of humerus fracture. EXAM: LEFT SHOULDER - 2+ VIEW COMPARISON:  Left shoulder and humerus series 03/23/2021. FINDINGS: Chronic deformities of both the proximal left  humerus and mid humeral shaft appear unchanged since last month, with possible developing pseudoarthrosis at the glenohumeral joint. Osteopenia. Grossly intact visible left clavicle and scapula. Visible left chest appears stable. IMPRESSION: Multiple chronic left humerus fractures. No acute osseous abnormality identified. Electronically Signed   By: Odessa FlemingH  Hall M.D.   On: 04/23/2021 09:49   DG Hip Unilat With Pelvis 2-3 Views Left  Result Date: 04/23/2021 CLINICAL DATA:  Fall.  Found on floor EXAM: DG HIP (WITH OR WITHOUT PELVIS) 2-3V LEFT COMPARISON:  None. FINDINGS: Hips are located. No evidence of pelvic fracture or sacral fracture. Dedicated view of the LEFT hip demonstrates no femoral neck fracture. IMPRESSION: No pelvic fracture or LEFT hip fracture Electronically Signed   By: Genevive BiStewart  Edmunds M.D.   On: 04/23/2021 09:53    Procedures Procedures   Medications Ordered in ED Medications - No data to display  ED Course  I have reviewed the triage vital signs and the nursing notes.  Pertinent labs & imaging results that were available during my care of the patient were reviewed by me and considered in my medical decision making (see chart for details).  Clinical Course as of 04/23/21 1729  Wynelle LinkSun Apr 23, 2021  [id number] Imaging showing left humeral neck fracture and left midshaft humerus fracture although old appearing.  Awaiting radiology reading. [MB]  1030 Reviewed results of imaging with patient and daughter.  She was aware of the prior fractures in the left arm.  She wants to take her bicarb back to her facility. [MB]    Clinical Course User Index [MB] Terrilee FilesButler, Keyry Iracheta C, MD   MDM Rules/Calculators/A&P                         This patient complains of fall and left arm pain; this involves an extensive number of treatment Options and is a complaint that carries with it a high risk of complications and Morbidity. The differential includes skull fracture, cervical fracture, intracranial bleed,  pneumothorax, fracture, dislocation  I ordered imaging studies which included head and cervical spine CT, chest x-ray, left shoulder humerus forearm and pelvis and left hip and I independently    visualized and interpreted imaging which showed old fractures left arm.  No acute fractures identified. Additional history obtained from patient's family member Previous records obtained and reviewed in epic, was seen for a fall about 1 month ago.  After the interventions stated above, I reevaluated the patient and found patient was otherwise hemodynamically stable.  Daughter would like to drive her back to her facility and get some lunch on the way.  We will try sling for comfort.  Return instructions discussed.   Final Clinical Impression(s) / ED Diagnoses Final diagnoses:  Fall, initial encounter  Left arm pain    Rx / DC  Orders ED Discharge Orders     None        Terrilee Files, MD 04/23/21 1731

## 2021-04-23 NOTE — Discharge Instructions (Addendum)
You were seen in the emergency department for evaluation of injuries from a fall.  You had a CAT scan of your head and neck along with chest x-ray, x-rays of your left arm pelvis and left hip.  There were multiple old fractures identified but no new fracture was seen.  Ice to affected areas.  Sling for comfort.  Tylenol for pain.  Follow-up with your doctor.  Return to the emergency department if any worsening or concerning symptoms

## 2021-04-26 NOTE — Telephone Encounter (Signed)
Left message for Kathy to call back

## 2021-04-26 NOTE — Telephone Encounter (Signed)
Order ONO if not already done pls

## 2021-04-27 NOTE — Telephone Encounter (Signed)
Called and spoke to pt's daughter. Informed her of the recs per Dr. Wynona Neat. ONO order placed. Pts daughter verbalized understanding and denied any further questions or concerns at this time.

## 2021-05-11 ENCOUNTER — Encounter: Payer: Self-pay | Admitting: Pulmonary Disease

## 2021-05-22 ENCOUNTER — Telehealth: Payer: Self-pay | Admitting: Pulmonary Disease

## 2021-05-22 NOTE — Telephone Encounter (Signed)
Olegario Messier, pts daughter is wanting to know the results of the overnight pulse oximetry results that was done on 05/11/2021. Pls regard; [phone].

## 2021-05-23 ENCOUNTER — Telehealth: Payer: Self-pay

## 2021-05-23 NOTE — Telephone Encounter (Signed)
ONO is faxed and in Dr. Trena Platt mail.  He is out until Monday on vacation (05/29/21) Called let Daughter know this and she is okay and looks forward to hearing from Dr. Wynona Neat next week.   Will route to him so he is aware

## 2021-05-23 NOTE — Telephone Encounter (Signed)
Referral notes received from [facility], Phone #: [phone], Fax #: [phone]   A copy of the notes have been placed in the scheduling box for check-out to pick-up and to enter referral. Original notes placed in file cabinet.

## 2021-05-26 ENCOUNTER — Emergency Department (HOSPITAL_BASED_OUTPATIENT_CLINIC_OR_DEPARTMENT_OTHER): Payer: Medicare Other | Admitting: Radiology

## 2021-05-26 ENCOUNTER — Observation Stay (HOSPITAL_BASED_OUTPATIENT_CLINIC_OR_DEPARTMENT_OTHER)
Admission: EM | Admit: 2021-05-26 | Discharge: 2021-05-28 | Disposition: A | Discharge status: Home / Self Care | Payer: Medicare Other | Attending: Internal Medicine | Admitting: Internal Medicine

## 2021-05-26 ENCOUNTER — Emergency Department (HOSPITAL_BASED_OUTPATIENT_CLINIC_OR_DEPARTMENT_OTHER): Payer: Medicare Other

## 2021-05-26 ENCOUNTER — Other Ambulatory Visit: Payer: Self-pay

## 2021-05-26 ENCOUNTER — Encounter (HOSPITAL_BASED_OUTPATIENT_CLINIC_OR_DEPARTMENT_OTHER): Payer: Self-pay | Admitting: *Deleted

## 2021-05-26 DIAGNOSIS — Z79899 Other long term (current) drug therapy: Secondary | ICD-10-CM | POA: Diagnosis not present

## 2021-05-26 DIAGNOSIS — S065X9A Traumatic subdural hemorrhage with loss of consciousness of unspecified duration, initial encounter: Secondary | ICD-10-CM

## 2021-05-26 DIAGNOSIS — Z20822 Contact with and (suspected) exposure to covid-19: Secondary | ICD-10-CM | POA: Insufficient documentation

## 2021-05-26 DIAGNOSIS — K219 Gastro-esophageal reflux disease without esophagitis: Secondary | ICD-10-CM | POA: Diagnosis not present

## 2021-05-26 DIAGNOSIS — Z7982 Long term (current) use of aspirin: Secondary | ICD-10-CM | POA: Diagnosis not present

## 2021-05-26 DIAGNOSIS — K449 Diaphragmatic hernia without obstruction or gangrene: Secondary | ICD-10-CM

## 2021-05-26 DIAGNOSIS — J90 Pleural effusion, not elsewhere classified: Secondary | ICD-10-CM

## 2021-05-26 DIAGNOSIS — E782 Mixed hyperlipidemia: Secondary | ICD-10-CM

## 2021-05-26 DIAGNOSIS — E785 Hyperlipidemia, unspecified: Secondary | ICD-10-CM

## 2021-05-26 DIAGNOSIS — I639 Cerebral infarction, unspecified: Secondary | ICD-10-CM

## 2021-05-26 DIAGNOSIS — R4781 Slurred speech: Secondary | ICD-10-CM | POA: Diagnosis present

## 2021-05-26 DIAGNOSIS — E039 Hypothyroidism, unspecified: Secondary | ICD-10-CM | POA: Diagnosis not present

## 2021-05-26 DIAGNOSIS — J449 Chronic obstructive pulmonary disease, unspecified: Secondary | ICD-10-CM | POA: Diagnosis not present

## 2021-05-26 DIAGNOSIS — S065XAA Traumatic subdural hemorrhage with loss of consciousness status unknown, initial encounter: Secondary | ICD-10-CM | POA: Diagnosis present

## 2021-05-26 DIAGNOSIS — I6200 Nontraumatic subdural hemorrhage, unspecified: Secondary | ICD-10-CM | POA: Diagnosis not present

## 2021-05-26 LAB — CBC WITH DIFFERENTIAL/PLATELET
Abs Immature Granulocytes: 0.04 10*3/uL (ref 0.00–0.07)
Basophils Absolute: 0 10*3/uL (ref 0.0–0.1)
Basophils Relative: 0 %
Eosinophils Absolute: 0 10*3/uL (ref 0.0–0.5)
Eosinophils Relative: 1 %
HCT: 37.5 % (ref 36.0–46.0)
Hemoglobin: 12.1 g/dL (ref 12.0–15.0)
Immature Granulocytes: 1 %
Lymphocytes Relative: 14 %
Lymphs Abs: 1.1 10*3/uL (ref 0.7–4.0)
MCH: 30.9 pg (ref 26.0–34.0)
MCHC: 32.3 g/dL (ref 30.0–36.0)
MCV: 95.7 fL (ref 80.0–100.0)
Monocytes Absolute: 0.8 10*3/uL (ref 0.1–1.0)
Monocytes Relative: 10 %
Neutro Abs: 5.8 10*3/uL (ref 1.7–7.7)
Neutrophils Relative %: 74 %
Platelets: 244 10*3/uL (ref 150–400)
RBC: 3.92 MIL/uL (ref 3.87–5.11)
RDW: 13.2 % (ref 11.5–15.5)
WBC: 7.8 10*3/uL (ref 4.0–10.5)
nRBC: 0 % (ref 0.0–0.2)

## 2021-05-26 LAB — COMPREHENSIVE METABOLIC PANEL
ALT: 7 U/L (ref 0–44)
AST: 13 U/L — ABNORMAL LOW (ref 15–41)
Albumin: 3.9 g/dL (ref 3.5–5.0)
Alkaline Phosphatase: 63 U/L (ref 38–126)
Anion gap: 11 (ref 5–15)
BUN: 18 mg/dL (ref 8–23)
CO2: 28 mmol/L (ref 22–32)
Calcium: 9.3 mg/dL (ref 8.9–10.3)
Chloride: 100 mmol/L (ref 98–111)
Creatinine, Ser: 0.6 mg/dL (ref 0.44–1.00)
GFR, Estimated: >60 mL/min (ref >60)
Glucose, Bld: 109 mg/dL — ABNORMAL HIGH (ref 70–99)
Potassium: 4.4 mmol/L (ref 3.5–5.1)
Sodium: 139 mmol/L (ref 135–145)
Total Bilirubin: 0.4 mg/dL (ref 0.3–1.2)
Total Protein: 7.3 g/dL (ref 6.5–8.1)

## 2021-05-26 LAB — RESP PANEL BY RT-PCR (FLU A&B, COVID) ARPGX2
Influenza A by PCR: NEGATIVE
Influenza B by PCR: NEGATIVE
SARS Coronavirus 2 by RT PCR: NEGATIVE

## 2021-05-26 LAB — CBG MONITORING, ED: Glucose-Capillary: 128 mg/dL — ABNORMAL HIGH (ref 70–99)

## 2021-05-26 LAB — TSH: TSH: 2.271 u[IU]/mL (ref 0.350–4.500)

## 2021-05-26 MED ORDER — SODIUM CHLORIDE 0.9 % IV SOLN: INTRAVENOUS | Status: DC

## 2021-05-26 NOTE — ED Notes (Signed)
Care handoff report given to Brain RN on 3W at [facility]

## 2021-05-26 NOTE — Progress Notes (Signed)
Patient arrived to [facility] 3W  at 2000

## 2021-05-26 NOTE — H&P (Signed)
History and Physical  Kaitlin Shelton LTJ:[id number] DOB: 1929/06/21 DOA: 05/26/2021  Referring physician:  Rolan Bucco, MD PCP: Uvaldo Bristle, NP  Patient coming from: SNF  Chief Complaint:   HPI: Kaitlin Shelton is a 87 y.o. female with medical history significant for hypothyroidism, GERD, hyperlipidemia, COPD, prior CVA who presents to [facility] ED via EMS from SNF due to slurred speech.  Patient was unable to provide a history, history was obtained from ED medical record.  Patient had a fall on 05/20/2021, she was not on any anticoagulation.  CT of head done on at the ED showed 8 mm subdural hematoma.  Neurosurgery (Dr. Wynetta Emery) was consulted by ED physician and recommended to repeat CT of head after 6 hours and discharge from ED or to admit overnight and repeat CT of head in the morning.  Patient's family member preferred admitting patient overnight for observation.  She was transferred to [facility] for further evaluation and management.  She denies chest pain, shortness of breath, headache, nausea, vomiting or abdominal pain.  ED Course:  In the emergency department, patient was hemodynamically stable.  Work-up in the ED showed normal CBC and BMP, influenza A, B and SARS coronavirus 2 was negative. Chest x-ray showed cardiomegaly with small left-sided pleural effusion and left basilar airspace disease and hiatal hernia. CT of head without contrast showed Mixed density left convexity subdural hematoma with acute areas of hemorrhage measuring up to 8 mm maximum thickness with small amount of acute left tentorial subdural hematoma. No significant midline shift and Slight asymmetric hypodensity in the left thalamus compared to previous exam, question age indeterminate infarct. IV hydration was provided.  Hospitalist was consulted and patient was accepted for transfer from [facility].  Review of Systems: Constitutional: Negative for chills and fever.  HENT: Negative for ear pain and sore throat.    Eyes: Negative for pain and visual disturbance.  Respiratory: Negative for cough, chest tightness and shortness of breath.   Cardiovascular: Negative for chest pain and palpitations.  Gastrointestinal: Negative for abdominal pain and vomiting.  Endocrine: Negative for polyphagia and polyuria.  Genitourinary: Negative for decreased urine volume, dysuria, enuresis Musculoskeletal: Negative for arthralgias and back pain.  Skin: Negative for color change and rash.  Allergic/Immunologic: Negative for immunocompromised state.  Neurological: Positive for weakness.  Negative for tremors, syncope, speech difficulty Hematological: Does not bruise/bleed easily.  All other systems reviewed and are negative   Past Medical History:  Diagnosis Date   Depression    High cholesterol    Hypertension    Thyroid disease    History reviewed. No pertinent surgical history.  Social History:  reports that she has never smoked. She has never used smokeless tobacco. She reports that she does not drink alcohol and does not use drugs.   Allergies  Allergen Reactions   Codeine     No family history on file.   Prior to Admission medications   Medication Sig Start Date End Date Taking? Authorizing Provider  albuterol (VENTOLIN HFA) 108 (90 Base) MCG/ACT inhaler ProAir HFA 90 mcg/actuation aerosol inhaler    [provider]  arformoterol (BROVANA) 15 MCG/2ML NEBU Brovana 15 mcg/2 mL solution for nebulization  Inhale 2 mL twice a day by inhalation route for 30 days.    [provider]  aspirin EC 81 MG tablet Take 81 mg by mouth daily.    [provider]  benzonatate (TESSALON) 100 MG capsule benzonatate 100 mg capsule    [provider]  diltiazem (CARDIZEM  CD) 180 MG 24 hr capsule Cartia XT 180 mg capsule,extended release 04/25/20   [provider]  diltiazem (CARDIZEM) 120 MG tablet Take 120 mg by mouth 4 (four) times daily.    [provider]   DULoxetine (CYMBALTA) 30 MG capsule Take 30 mg by mouth daily.    [provider]  folic acid (FOLVITE) 1 MG tablet Take 1 mg by mouth daily.    [provider]  hydrochlorothiazide (HYDRODIURIL) 12.5 MG tablet Take 12.5 mg by mouth daily.    [provider]  ipratropium-albuterol (DUONEB) 0.5-2.5 (3) MG/3ML SOLN ipratropium 0.5 mg-albuterol 3 mg (2.5 mg base)/3 mL nebulization soln  USE 1 VIAL IN NEBULIZER EVERY 4 TO 6 HOURS AS NEEDED    [provider]  levothyroxine (SYNTHROID, LEVOTHROID) 125 MCG tablet Take 125 mcg by mouth daily before breakfast.    [provider]  omeprazole (PRILOSEC) 20 MG capsule Take 20 mg by mouth daily.    [provider]  pravastatin (PRAVACHOL) 20 MG tablet Take 20 mg by mouth daily.    [provider]  predniSONE (DELTASONE) 20 MG tablet prednisone 20 mg tablet    [provider]    Physical Exam: BP (!) 142/72 (BP Location: Right Arm)   Pulse 78   Temp 98.2 F (36.8 C) (Oral)   Resp 18   Ht 5\' 4"  (1.626 m)   Wt 88 kg   SpO2 98%   BMI 33.30 kg/m   General: 87 y.o. year-old female well developed well nourished in no acute distress.  Alert and oriented x3. HEENT: NCAT, EOMI Neck: Supple, trachea medial Cardiovascular: Regular rate and rhythm with no rubs or gallops.  No thyromegaly or JVD noted.  No lower extremity edema. 2/4 pulses in all 4 extremities. Respiratory: Clear to auscultation with no wheezes or rales. Good inspiratory effort. Abdomen: Soft, nontender nondistended with normal bowel sounds x4 quadrants. Muskuloskeletal: +2 B/L lower extremity edema.  No cyanosis or edema noted bilaterally Neuro: CN II-XII intact, sensation, reflexes intact Skin: No ulcerative lesions noted or rashes Psychiatry: Mood is appropriate for condition and setting          Labs on Admission:  Basic Metabolic Panel: Recent Labs  Lab 05/26/21 1436  NA 139  K 4.4  CL 100  CO2 28   GLUCOSE 109*  BUN 18  CREATININE 0.60  CALCIUM 9.3   Liver Function Tests: Recent Labs  Lab 05/26/21 1436  AST 13*  ALT 7  ALKPHOS 63  BILITOT 0.4  PROT 7.3  ALBUMIN 3.9   No results for input(s): LIPASE, AMYLASE in the last 168 hours. No results for input(s): AMMONIA in the last 168 hours. CBC: Recent Labs  Lab 05/26/21 1436  WBC 7.8  NEUTROABS 5.8  HGB 12.1  HCT 37.5  MCV 95.7  PLT 244   Cardiac Enzymes: No results for input(s): CKTOTAL, CKMB, CKMBINDEX, TROPONINI in the last 168 hours.  BNP (last 3 results) No results for input(s): BNP in the last 8760 hours.  ProBNP (last 3 results) No results for input(s): PROBNP in the last 8760 hours.  CBG: Recent Labs  Lab 05/26/21 1524  GLUCAP 128*    Radiological Exams on Admission: DG Chest 2 View  Result Date: 05/26/2021 CLINICAL DATA:  Altered mental status EXAM: CHEST - 2 VIEW COMPARISON:  04/23/2021, 03/23/2021, 03/01/2021, 10/04/2017 FINDINGS: Electronic device over the left chest. Similar marked globular cardiomegaly. Small left-sided pleural effusion with airspace disease at left base.  Aortic atherosclerosis. No pneumothorax. Large hiatal hernia. Chronic left humerus deformity. IMPRESSION: 1. Cardiomegaly with small left-sided pleural effusion and left basilar airspace disease. 2. Hiatal hernia Electronically Signed   By: Jasmine Pang M.D.   On: 05/26/2021 15:31   CT HEAD WO CONTRAST  Result Date: 05/26/2021 CLINICAL DATA:  Mental status change EXAM: CT HEAD WITHOUT CONTRAST TECHNIQUE: Contiguous axial images were obtained from the base of the skull through the vertex without intravenous contrast. COMPARISON:  CT brain 04/23/2021 FINDINGS: Brain: Redemonstrated left greater than right cerebellar encephalomalacia. Mild ex vacuo dilatation of fourth ventricle. Mostly isodense left convexity subdural hematoma with more acute areas of blood seen along the left temporal convexity and at the left tentorium. This  measures 8 mm maximum thickness on coronal views. No midline shift. Stable positioning of right posterior shunt catheter with tip at the anterior left lateral ventricle. Atrophy and chronic small vessel ischemic changes of the white matter. Slight asymmetric hypodensity in the left thalamus compared to prior, indeterminate for infarct. Ventricle size is stable to slightly smaller compared to prior. Vascular: Evidence of prior left aneurysm coiling. Carotid vascular calcification. Skull: No fracture Sinuses/Orbits: Mucosal thickening in the sinuses. Other: None IMPRESSION: 1. Mixed density left convexity subdural hematoma with acute areas of hemorrhage measuring up to 8 mm maximum thickness with small amount of acute left tentorial subdural hematoma. No significant midline shift. 2. Slight asymmetric hypodensity in the left thalamus compared to previous exam, question age indeterminate infarct. 3. Atrophy and chronic small vessel ischemic changes of the white matter. Multiple left greater than right cerebellar infarcts. Stable positioning of right posterior shunt catheter with stable to slight decreased ventricular size. Critical Value/emergent results were called by telephone at the time of interpretation on 05/26/2021 at 3:29 pm to provider JULIE HAVILAND , who verbally acknowledged these results. Electronically Signed   By: Jasmine Pang M.D.   On: 05/26/2021 15:29    EKG: I independently viewed the EKG done and my findings are as followed: Normal sinus rhythm at rate of 81 bpm with VPCs and borderline Prolonged PR interval  Assessment/Plan Present on Admission:  Subdural hematoma (HCC)  Principal Problem:   Subdural hematoma (HCC) Active Problems:   Hiatal hernia   GERD (gastroesophageal reflux disease)   Pleural effusion on left   Hypothyroidism   Hyperlipidemia   CVA (cerebral vascular accident) (HCC)   COPD (chronic obstructive pulmonary disease) (HCC)   Left subdural hematoma Patient  recently had a fall CT of head without contrast showed Mixed density left convexity subdural hematoma with acute areas of hemorrhage measuring up to 8 mm maximum thickness with small amount of acute left tentorial subdural hematoma Neurosurgery was consulted and there was no indication for any surgical intervention at this time Repeat CT of head in the morning recommended Consider reconsulting neurosurgery for worsening CT findings/symptoms Continue fall precaution and neurochecks Continue PT and OT eval and treat  Left pleural effusion Chest x-ray showed cardiomegaly with small left-sided pleural effusion  Continue home hydrochlorothiazide  Hiatal hernia/GERD Continue Protonix  Hypothyroidism Continue Synthroid  Hyperlipidemia Continue pravastatin  History of CVA Continue aspirin and Pravachol Continue fall precaution and neurochecks Continue PT/OT eval and treat  COPD Continue home meds once confirmed by pharmacy  NOTE: Please continue with home prednisone if confirmed by pharmacy  DVT prophylaxis: SCDs  Code Status: DNR  Family Communication: None at bedside  Disposition Plan:  Patient is from:  home Anticipated DC to:                   SNF or family members home Anticipated DC date:               2-3 days Anticipated DC barriers:          Patient requires inpatient management due to slow draw hematoma requiring a repeat CT of head without contrast in the morning  Consults called: Neurosurgery (by ED team)  Admission status: Observation    Frankey Shown MD [facility]  05/26/2021, 10:07 PM

## 2021-05-26 NOTE — ED Notes (Signed)
Sent from [facility] home after telling staff 30 min ago she does not feel well. Pt has no complaints. Daughter listed pt has decreased appetite and dementia.

## 2021-05-26 NOTE — Progress Notes (Signed)
Received a phone call from Facility: [facility]   Requesting MD: Dr. 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 Head CT 94mm subdural hematoma. Had fall on 05/20/21. Not on any anti coagulation. Talked to Dr. Wynetta Emery with neurosurgery who suggested repeat head CT in 6 hours and send home or admit overnight to watch with repeat CT head in the AM. Not surgical case.   Plan of care: observation and repeat head scan in AM.   The patient will be accepted for admission to telemetry at [facility] when bed is available.  will be consulted by requesting physician prior to transfer.  Neurosurgery will need to be consulted if change in clinical status.    Nursing staff, Please call the [facility] Admits & Consults System-Wide number at the top of Amion at the time of the patient's arrival so that the patient can be paged to the admitting physician.   Lanney Gins M.D. [facility]

## 2021-05-26 NOTE — ED Triage Notes (Signed)
Daughter reports pt has been slurring her speech since Wednesday and has had decreased appetite. Able to answer most questions.

## 2021-05-26 NOTE — ED Provider Notes (Signed)
Patient care was taken over from Dr. Particia Nearing.  Patient is noted to have an 8 mm subdural on her CT scan.  I spoke with Dr. Wynetta Emery with neurosurgery.  He advised that patient does not need any operative intervention.  He recommended either keeping the patient in the ED for 6 hours and repeating a CT scan or admitting to medicine for overnight observation and repeat CT scan in the morning.  I discussed these options with the family member who is at bedside and they request that patient be admitted for observation.  I spoke with Dr. Artis Flock with the hospitalist service who will admit the patient.   Rolan Bucco, MD 05/26/21 1734

## 2021-05-26 NOTE — ED Notes (Signed)
Pt placed on bedpan, unable to void. Purewick applied.

## 2021-05-26 NOTE — ED Provider Notes (Signed)
[facility] EMERGENCY DEPT Provider Note   CSN: [id number] Arrival date & time: 05/26/21  1345     History Chief Complaint  Patient presents with   Aphasia   Decreased Appetite    Kaitlin Shelton is a 87 y.o. female.  Pt presents to the ED today with decreased appetite and slurring speech since Wednesday, 8/17.  Pt said she is eating.  Her daughter said she has not been eating.  Pt usually gets around with a wheelchair, but can walk a little.  She is moving everything.        Past Medical History:  Diagnosis Date   Depression    High cholesterol    Hypertension    Thyroid disease     There are no problems to display for this patient.   History reviewed. No pertinent surgical history.   OB History   No obstetric history on file.     No family history on file.  Social History   Tobacco Use   Smoking status: Never   Smokeless tobacco: Never  Substance Use Topics   Alcohol use: Never   Drug use: Never    Home Medications Prior to Admission medications   Medication Sig Start Date End Date Taking? Authorizing Provider  albuterol (VENTOLIN HFA) 108 (90 Base) MCG/ACT inhaler ProAir HFA 90 mcg/actuation aerosol inhaler    [provider]  arformoterol (BROVANA) 15 MCG/2ML NEBU Brovana 15 mcg/2 mL solution for nebulization  Inhale 2 mL twice a day by inhalation route for 30 days.    [provider]  aspirin EC 81 MG tablet Take 81 mg by mouth daily.    [provider]  benzonatate (TESSALON) 100 MG capsule benzonatate 100 mg capsule    [provider]  diltiazem (CARDIZEM CD) 180 MG 24 hr capsule Cartia XT 180 mg capsule,extended release 04/25/20   [provider]  diltiazem (CARDIZEM) 120 MG tablet Take 120 mg by mouth 4 (four) times daily.    [provider]  DULoxetine (CYMBALTA) 30 MG capsule Take 30 mg by mouth daily.    [provider]  folic acid (FOLVITE) 1 MG tablet Take 1 mg by  mouth daily.    [provider]  hydrochlorothiazide (HYDRODIURIL) 12.5 MG tablet Take 12.5 mg by mouth daily.    [provider]  ipratropium-albuterol (DUONEB) 0.5-2.5 (3) MG/3ML SOLN ipratropium 0.5 mg-albuterol 3 mg (2.5 mg base)/3 mL nebulization soln  USE 1 VIAL IN NEBULIZER EVERY 4 TO 6 HOURS AS NEEDED    [provider]  levothyroxine (SYNTHROID, LEVOTHROID) 125 MCG tablet Take 125 mcg by mouth daily before breakfast.    [provider]  omeprazole (PRILOSEC) 20 MG capsule Take 20 mg by mouth daily.    [provider]  pravastatin (PRAVACHOL) 20 MG tablet Take 20 mg by mouth daily.    [provider]  predniSONE (DELTASONE) 20 MG tablet prednisone 20 mg tablet    [provider]    Allergies    Codeine  Review of Systems   Review of Systems  Neurological:  Positive for weakness.  All other systems reviewed and are negative.  Physical Exam Updated Vital Signs BP 118/76 (BP Location: Right Arm)   Pulse 87   Temp 98.1 F (36.7 C) (Oral)   Resp 18   Ht 5\' 5"  (1.651 m)   Wt 88.9 kg   SpO2 95%   BMI 32.62 kg/m   Physical Exam Vitals and nursing note reviewed.  Constitutional:      Appearance: Normal appearance.  HENT:     Head: Normocephalic.     Nose: Nose normal.     Mouth/Throat:     Mouth: Mucous membranes are moist.     Pharynx: Oropharynx is clear.  Eyes:     Extraocular Movements: Extraocular movements intact.     Conjunctiva/sclera: Conjunctivae normal.     Pupils: Pupils are equal, round, and reactive to light.  Cardiovascular:     Rate and Rhythm: Bradycardia present.     Pulses: Normal pulses.     Heart sounds: Normal heart sounds.  Pulmonary:     Effort: Pulmonary effort is normal.     Breath sounds: Normal breath sounds.  Abdominal:     General: Abdomen is flat. Bowel sounds are normal.     Palpations: Abdomen is soft.  Musculoskeletal:     Cervical back: Normal range of motion and  neck supple.     Right lower leg: Edema present.     Left lower leg: Edema present.  Skin:    General: Skin is warm.     Capillary Refill: Capillary refill takes less than 2 seconds.  Neurological:     General: No focal deficit present.     Mental Status: She is alert and oriented to person, place, and time.  Psychiatric:        Mood and Affect: Mood normal.        Behavior: Behavior normal.        Thought Content: Thought content normal.        Judgment: Judgment normal.    ED Results / Procedures / Treatments   Labs (all labs ordered are listed, but only abnormal results are displayed) Labs Reviewed  CBG MONITORING, ED - Abnormal; Notable for the following components:      Result Value   Glucose-Capillary 128 (*)    All other components within normal limits  CBC WITH DIFFERENTIAL/PLATELET  COMPREHENSIVE METABOLIC PANEL  URINALYSIS, ROUTINE W REFLEX MICROSCOPIC  TSH    EKG EKG Interpretation  Date/Time:  Friday May 26 2021 14:19:54 EDT Ventricular Rate:  81 PR Interval:  215 QRS Duration: 91 QT Interval:  399 QTC Calculation: 464 R Axis:   53 Text Interpretation: Sinus rhythm Ventricular premature complex Borderline prolonged PR interval Abnormal R-wave progression, early transition PVCs are new Confirmed by Jacalyn Lefevre [phone]) on 05/26/2021 2:47:27 PM  Radiology DG Chest 2 View  Result Date: 05/26/2021 CLINICAL DATA:  Altered mental status EXAM: CHEST - 2 VIEW COMPARISON:  04/23/2021, 03/23/2021, 03/01/2021, 10/04/2017 FINDINGS: Electronic device over the left chest. Similar marked globular cardiomegaly. Small left-sided pleural effusion with airspace disease at left base. Aortic atherosclerosis. No pneumothorax. Large hiatal hernia. Chronic left humerus deformity. IMPRESSION: 1. Cardiomegaly with small left-sided pleural effusion and left basilar airspace disease. 2. Hiatal hernia Electronically Signed   By: Jasmine Pang M.D.   On: 05/26/2021 15:31   CT HEAD  WO CONTRAST  Result Date: 05/26/2021 CLINICAL DATA:  Mental status change EXAM: CT HEAD WITHOUT CONTRAST TECHNIQUE: Contiguous axial images were obtained from the base of the skull through the vertex without intravenous contrast. COMPARISON:  CT brain 04/23/2021 FINDINGS: Brain: Redemonstrated left greater than right cerebellar encephalomalacia. Mild ex vacuo dilatation of fourth ventricle. Mostly isodense left convexity subdural hematoma with more acute areas of blood seen along the left temporal convexity and at the left tentorium. This measures 8 mm maximum thickness on coronal views. No midline shift. Stable  positioning of right posterior shunt catheter with tip at the anterior left lateral ventricle. Atrophy and chronic small vessel ischemic changes of the white matter. Slight asymmetric hypodensity in the left thalamus compared to prior, indeterminate for infarct. Ventricle size is stable to slightly smaller compared to prior. Vascular: Evidence of prior left aneurysm coiling. Carotid vascular calcification. Skull: No fracture Sinuses/Orbits: Mucosal thickening in the sinuses. Other: None IMPRESSION: 1. Mixed density left convexity subdural hematoma with acute areas of hemorrhage measuring up to 8 mm maximum thickness with small amount of acute left tentorial subdural hematoma. No significant midline shift. 2. Slight asymmetric hypodensity in the left thalamus compared to previous exam, question age indeterminate infarct. 3. Atrophy and chronic small vessel ischemic changes of the white matter. Multiple left greater than right cerebellar infarcts. Stable positioning of right posterior shunt catheter with stable to slight decreased ventricular size. Critical Value/emergent results were called by telephone at the time of interpretation on 05/26/2021 at 3:29 pm to provider Allyne Hebert , who verbally acknowledged these results. Electronically Signed   By: Jasmine Pang M.D.   On: 05/26/2021 15:29     Procedures Procedures   Medications Ordered in ED Medications  0.9 %  sodium chloride infusion ( Intravenous New Bag/Given 05/26/21 1519)    ED Course  I have reviewed the triage vital signs and the nursing notes.  Pertinent labs & imaging results that were available during my care of the patient were reviewed by me and considered in my medical decision making (see chart for details).    MDM Rules/Calculators/A&P                           Pt signed out to Dr. Fredderick Phenix at shift change. Final Clinical Impression(s) / ED Diagnoses Final diagnoses:  SDH (subdural hematoma) (HCC)    Rx / DC Orders ED Discharge Orders     None        Jacalyn Lefevre, MD 05/26/21 1536

## 2021-05-27 ENCOUNTER — Observation Stay (HOSPITAL_COMMUNITY): Payer: Medicare Other

## 2021-05-27 LAB — COMPREHENSIVE METABOLIC PANEL
ALT: 11 U/L (ref 0–44)
AST: 10 U/L — ABNORMAL LOW (ref 15–41)
Albumin: 2.8 g/dL — ABNORMAL LOW (ref 3.5–5.0)
Alkaline Phosphatase: 55 U/L (ref 38–126)
Anion gap: 6 (ref 5–15)
BUN: 12 mg/dL (ref 8–23)
CO2: 28 mmol/L (ref 22–32)
Calcium: 8.2 mg/dL — ABNORMAL LOW (ref 8.9–10.3)
Chloride: 101 mmol/L (ref 98–111)
Creatinine, Ser: 0.5 mg/dL (ref 0.44–1.00)
GFR, Estimated: >60 mL/min (ref >60)
Glucose, Bld: 121 mg/dL — ABNORMAL HIGH (ref 70–99)
Potassium: 3.2 mmol/L — ABNORMAL LOW (ref 3.5–5.1)
Sodium: 135 mmol/L (ref 135–145)
Total Bilirubin: 0.6 mg/dL (ref 0.3–1.2)
Total Protein: 5.7 g/dL — ABNORMAL LOW (ref 6.5–8.1)

## 2021-05-27 LAB — APTT: aPTT: 29 seconds (ref 24–36)

## 2021-05-27 LAB — CBC
HCT: 32.8 % — ABNORMAL LOW (ref 36.0–46.0)
Hemoglobin: 10.7 g/dL — ABNORMAL LOW (ref 12.0–15.0)
MCH: 31.5 pg (ref 26.0–34.0)
MCHC: 32.6 g/dL (ref 30.0–36.0)
MCV: 96.5 fL (ref 80.0–100.0)
Platelets: 207 10*3/uL (ref 150–400)
RBC: 3.4 MIL/uL — ABNORMAL LOW (ref 3.87–5.11)
RDW: 13.2 % (ref 11.5–15.5)
WBC: 4.6 10*3/uL (ref 4.0–10.5)
nRBC: 0 % (ref 0.0–0.2)

## 2021-05-27 LAB — MAGNESIUM: Magnesium: 2 mg/dL (ref 1.7–2.4)

## 2021-05-27 LAB — PROTIME-INR
INR: 1.1 (ref 0.8–1.2)
Prothrombin Time: 14.1 seconds (ref 11.4–15.2)

## 2021-05-27 LAB — PHOSPHORUS: Phosphorus: 3.4 mg/dL (ref 2.5–4.6)

## 2021-05-27 MED ORDER — DULOXETINE HCL 30 MG PO CPEP: 30.0000 mg | ORAL_CAPSULE | Freq: Every morning | ORAL | 0 refills | Status: AC

## 2021-05-27 MED ORDER — DULOXETINE HCL 30 MG PO CPEP: 30.0000 mg | ORAL_CAPSULE | Freq: Every morning | ORAL | 0 refills | Status: DC

## 2021-05-27 MED ORDER — POTASSIUM CHLORIDE CRYS ER 20 MEQ PO TBCR
40.0000 meq | EXTENDED_RELEASE_TABLET | ORAL | Status: AC
Start: 2021-05-27 — End: 2021-05-27
  Administered 2021-05-27 (×2): 40 meq via ORAL
  Filled 2021-05-27 (×2): qty 2

## 2021-05-27 NOTE — TOC CAGE-AID Note (Signed)
Transition of Care [facility]) - CAGE-AID Screening   Patient Details  Name: Kaitlin Shelton MRN: [id number] Date of Birth: 19-Jun-1929   Hewitt Shorts, RN Trauma Response Nurse Phone Number: [phone] 05/27/2021, 1:37 PM   CAGE-AID Screening:    Have You Ever Felt You Ought to Cut Down on Your Drinking or Drug Use?: No Have People Annoyed You By Critizing Your Drinking Or Drug Use?: No Have You Felt Bad Or Guilty About Your Drinking Or Drug Use?: No Have You Ever Had a Drink or Used Drugs First Thing In The Morning to Steady Your Nerves or to Get Rid of a Hangover?: No CAGE-AID Score: 0  Substance Abuse Education Offered: No (denies any drug or alcohol use. Lives in a memory care unit in a SNF)

## 2021-05-27 NOTE — Progress Notes (Signed)
Head Ct stable, very tiny SDH. No need for neurosurgery intervention. Fine to discharge back to SNF.

## 2021-05-27 NOTE — TOC Transition Note (Signed)
Transition of Care [facility]) - CM/SW Discharge Note   Patient Details  Name: Kaitlin Shelton MRN: [id number] Date of Birth: November 12, 1928  [facility] CM/SW Contact:  Carley Hammed, LCSWA Phone Number: 05/27/2021, 4:35 PM   Clinical Narrative:    Pt to be transported to [facility] [place] via [place].  Nurse to call report to [phone].   Final next level of care: Assisted Living Barriers to Discharge: Barriers Resolved   Patient Goals and CMS Choice        Discharge Placement              Patient chooses bed at:  [facility]) Patient to be transferred to facility by: [facility] Name of family member notified: Olegario Messier Patient and family notified of of transfer: 05/27/21  Discharge Plan and Services                                     Social Determinants of Health (SDOH) Interventions     Readmission Risk Interventions No flowsheet data found.

## 2021-05-27 NOTE — Evaluation (Addendum)
Physical Therapy Evaluation Patient Details Name: Kaitlin Shelton MRN: [id number] DOB: 1929-05-08 Today's Date: 05/27/2021   History of Present Illness  87 y/o female presented to ED on 8/19 from [facility] nursing home with complaints of slurred speech and decreased appetite since 8/17. Patient had fall on 8/13. CT head wrevelaed 8 mm acute L SDH with no mildine shift and slight asymmetric hypodensity in L thalamus compared to previous exam, question age indeterminate infarct. PMH: dementia, HTN, hemiplegia 2/2 CVA, hypothyroidism, GERD, COPD  Clinical Impression  PTA, patient lived at memory care unit at [facility]. Patient utilized w/c primarily for mobility but was able to use [employer] for short distance. Patient presents with generalized weakness, impaired balance, decreased activity tolerance, impaired cognition, and impaired functional mobility. Patient required modA+2 for all mobility with HHAx1. Patient took pivotal steps towards recliner but required modA+2 due to balance. Patient will benefit from skilled PT services during acute stay to address listed deficits. Recommend HHPT and return memory care unit to improve mobility and safety.     Follow Up Recommendations Home health PT (return to ALF - secured memory care unit)    Equipment Recommendations  None recommended by PT    Recommendations for Other Services       Precautions / Restrictions Precautions Precautions: Fall Precaution Comments: Impaired L UE - multiple non operative fractures. Restrictions Weight Bearing Restrictions: No Other Position/Activity Restrictions: L arm is very painful      Mobility  Bed Mobility Overal bed mobility: Needs Assistance Bed Mobility: Supine to Sit     Supine to sit: Mod assist;+2 for physical assistance;HOB elevated          Transfers Overall transfer level: Needs assistance Equipment used: 1 person hand held assist Transfers: Sit to/from [employer] Transfers Sit to Stand: Mod assist;+2 physical assistance Stand pivot transfers: Mod assist;+2 physical assistance       General transfer comment: modA+2 to rise and steady. Patient able to take pivotal steps towards recliner requiring modA+2 for balance  Ambulation/Gait                Stairs            Wheelchair Mobility    Modified Rankin (Stroke Patients Only)       Balance Overall balance assessment: Needs assistance Sitting-balance support: Feet supported Sitting balance-Leahy Scale: Poor Sitting balance - Comments: initial heavy posterior lean, eventually could support herself sitting edge of bed with supervision   Standing balance support: Single extremity supported Standing balance-Leahy Scale: Poor Standing balance comment: relies on +2                             Pertinent Vitals/Pain Pain Assessment: Faces Faces Pain Scale: Hurts even more Pain Location: L arm with movement Pain Descriptors / Indicators: Sharp;Grimacing Pain Intervention(s): Monitored during session;Repositioned    Home Living Family/patient expects to be discharged to:: Assisted living               Home Equipment: Seana Underwood - 2 wheels;Shower seat;Grab bars - toilet;Grab bars - tub/shower;Hand held shower head;Wheelchair - manual Additional Comments: facility based equipment. Patient in memory care unit    Prior Function Level of Independence: Needs assistance   Gait / Transfers Assistance Needed: Assist for transfers and uses wheelchair for mobility  ADL's / Homemaking Assistance Needed: patient able to wash her face and feed herself.  Assist for bathing/dressing/meds from  staff        Hand Dominance   Dominant Hand: Right    Extremity/Trunk Assessment   Upper Extremity Assessment Upper Extremity Assessment: Defer to OT evaluation LUE Deficits / Details: chronic impairment. LUE: Unable to fully assess due to pain LUE Sensation: WNL LUE  Coordination: decreased fine motor;decreased gross motor    Lower Extremity Assessment Lower Extremity Assessment: Generalized weakness    Cervical / Trunk Assessment Cervical / Trunk Assessment: Kyphotic  Communication   Communication: Expressive difficulties  Cognition Arousal/Alertness: Awake/alert Behavior During Therapy: Anxious Overall Cognitive Status: History of cognitive impairments - at baseline                                 General Comments: Poor short term memory.  Increased time and VC's for command following.  Patient resides in locked memory care section of ALF.      General Comments      Exercises     Assessment/Plan    PT Assessment Patient needs continued PT services  PT Problem List Decreased strength;Decreased activity tolerance;Decreased balance;Decreased mobility;Decreased cognition;Decreased coordination;Decreased safety awareness;Cardiopulmonary status limiting activity       PT Treatment Interventions DME instruction;Gait training;Functional mobility training;Therapeutic activities;Therapeutic exercise;Balance training;Patient/family education;Wheelchair mobility training    PT Goals (Current goals can be found in the Care Plan section)  Acute Rehab PT Goals Patient Stated Goal: daughter states wants to go back to ALF PT Goal Formulation: With family Time For Goal Achievement: 06/10/21 Potential to Achieve Goals: Fair    Frequency Min 2X/week   Barriers to discharge        Co-evaluation               AM-PAC PT "6 Clicks" Mobility  Outcome Measure Help needed turning from your back to your side while in a flat bed without using bedrails?: A Lot Help needed moving from lying on your back to sitting on the side of a flat bed without using bedrails?: Total Help needed moving to and from a bed to a chair (including a wheelchair)?: Total Help needed standing up from a chair using your arms (e.g., wheelchair or bedside  chair)?: Total Help needed to walk in hospital room?: Total Help needed climbing 3-5 steps with a railing? : Total 6 Click Score: 7    End of Session Equipment Utilized During Treatment: Gait belt;Oxygen Activity Tolerance: Patient tolerated treatment well Patient left: in chair;with call bell/phone within reach;with family/visitor present Nurse Communication: Mobility status PT Visit Diagnosis: Unsteadiness on feet (R26.81);Muscle weakness (generalized) (M62.81);History of falling (Z91.81);Difficulty in walking, not elsewhere classified (R26.2)    Time: 1039-1100 PT Time Calculation (min) (ACUTE ONLY): 21 min   Charges:   PT Evaluation $PT Eval Moderate Complexity: 1 Mod          Jozsef Wescoat A. Dan Humphreys PT, DPT Acute Rehabilitation Services Pager [phone] Office [phone]   Viviann Spare 05/27/2021, 12:55 PM

## 2021-05-27 NOTE — Evaluation (Signed)
Occupational Therapy Evaluation Patient Details Name: Kaitlin Shelton MRN: [id number] DOB: June 10, 1929 Today's Date: 05/27/2021    History of Present Illness 87 y/o female presented to ED on 8/19 from [facility] nursing home with complaints of slurred speech and decreased appetite since 8/17. Patient had fall on 8/13. CT head wrevelaed 8 mm acute L SDH with no mildine shift and slight asymmetric hypodensity in L thalamus compared to previous exam, question age indeterminate infarct. PMH: dementia, HTN, hemiplegia 2/2 CVA, hypothyroidism, GERD, COPD   Clinical Impression   Patient admitted with the diagnosis above.  PTA she lives in a locked memory care section of an ALF.  She required assist for bed mobility, tranfers, used a wheelchair for long distances, and assist for ADL except washing her face and self feeding.  She is close to her baseline for ADL completion, but transfers are requiring up to +2 Mod A.  OT is deferring to next level of care for [facility] OT at her facility.       Follow Up Recommendations  Other (comment) (Patient's daughter perfers return to [facility] locked unit with 24 hour assist as needed.) HH OT at facility.     Equipment Recommendations  None recommended by OT    Recommendations for Other Services       Precautions / Restrictions Precautions Precautions: Fall Precaution Comments: Impaired L UE - multiple non operative fractures. Restrictions Weight Bearing Restrictions: No Other Position/Activity Restrictions: L arm is very painful      Mobility Bed Mobility Overal bed mobility: Needs Assistance Bed Mobility: Supine to Sit     Supine to sit: Mod assist;+2 for physical assistance;HOB elevated       Patient Response: Cooperative  Transfers Overall transfer level: Needs assistance   Transfers: Sit to/from [employer] Transfers Sit to Stand: Mod assist;+2 physical assistance Stand pivot transfers: Mod assist;+2 physical assistance             Balance Overall balance assessment: Needs assistance Sitting-balance support: Feet supported Sitting balance-Leahy Scale: Poor Sitting balance - Comments: initial heavy posterior lean, eventually could support herself sitting edge of bed with supervision   Standing balance support: Single extremity supported Standing balance-Leahy Scale: Poor Standing balance comment: relies on +2                           ADL either performed or assessed with clinical judgement   ADL Overall ADL's : At baseline                                       General ADL Comments: able to wash face and feed herself with encouragement     Vision Baseline Vision/History: Wears glasses Wears Glasses: At all times Patient Visual Report: No change from baseline       Perception     Praxis      Pertinent Vitals/Pain Pain Assessment: Faces Faces Pain Scale: Hurts even more Pain Location: L arm with movement Pain Descriptors / Indicators: Sharp Pain Intervention(s): Monitored during session     Hand Dominance Right   Extremity/Trunk Assessment Upper Extremity Assessment Upper Extremity Assessment: Generalized weakness;LUE deficits/detail LUE Deficits / Details: chronic impairment. LUE: Unable to fully assess due to pain LUE Sensation: WNL LUE Coordination: decreased fine motor;decreased gross motor   Lower Extremity Assessment Lower Extremity Assessment: Defer to PT evaluation  Cervical / Trunk Assessment Cervical / Trunk Assessment: Kyphotic   Communication Communication Communication: Expressive difficulties   Cognition Arousal/Alertness: Awake/alert Behavior During Therapy: Anxious Overall Cognitive Status: History of cognitive impairments - at baseline                                 General Comments: Poor short term memory.  Increased time and VC's for command following.  Patient resides in locked memory care section of ALF.    General Comments       Exercises     Shoulder Instructions      Home Living Family/patient expects to be discharged to:: Assisted living                             Home Equipment: Walker - 2 wheels;Shower seat;Grab bars - toilet;Grab bars - tub/shower;Hand held shower head;Wheelchair - manual   Additional Comments: facility based equipment      Prior Functioning/Environment Level of Independence: Needs assistance  Gait / Transfers Assistance Needed: Assist for transfers and uses wheelchair for mobility ADL's / Homemaking Assistance Needed: patient able to wash her face and feed herself.  Assist for bathing/dressing/meds from staff            OT Problem List: Decreased strength;Decreased range of motion;Decreased activity tolerance;Impaired balance (sitting and/or standing);Impaired UE functional use      OT Treatment/Interventions:      OT Goals(Current goals can be found in the care plan section) Acute Rehab OT Goals OT Goal Formulation: Patient unable to participate in goal setting Time For Goal Achievement: 05/27/21 Potential to Achieve Goals: Good  OT Frequency:     Barriers to D/C:  None noted          Co-evaluation              AM-PAC OT "6 Clicks" Daily Activity     Outcome Measure Help from another person eating meals?: A Little Help from another person taking care of personal grooming?: A Little Help from another person toileting, which includes using toliet, bedpan, or urinal?: Total Help from another person bathing (including washing, rinsing, drying)?: Total Help from another person to put on and taking off regular upper body clothing?: Total Help from another person to put on and taking off regular lower body clothing?: Total 6 Click Score: 10   End of Session Equipment Utilized During Treatment: Gait belt Nurse Communication: Mobility status  Activity Tolerance: Patient tolerated treatment well Patient left: in chair;with  call bell/phone within reach;with chair alarm set;with family/visitor present  OT Visit Diagnosis: Unsteadiness on feet (R26.81);History of falling (Z91.81);Pain Pain - Right/Left: Left Pain - part of body: Arm                Time: 1040-1105 OT Time Calculation (min): 25 min Charges:  OT General Charges $OT Visit: 1 Visit OT Evaluation $OT Eval Moderate Complexity: 1 Mod  05/27/2021  Rich, OTR/L  Acute Rehabilitation Services  Office:  [phone]   Suzanna Obey 05/27/2021, 11:18 AM

## 2021-05-27 NOTE — Discharge Summary (Signed)
Physician Discharge Summary  Patient ID: Kaitlin Shelton MRN: [id number] DOB/AGE: 1929-06-26 87 y.o.  Admit date: 05/26/2021 Discharge date: 05/27/2021  Admission Diagnoses:  Discharge Diagnoses:  Principal Problem:   Subdural hematoma (HCC) Active Problems:   Hiatal hernia   GERD (gastroesophageal reflux disease)   Pleural effusion on left   Hypothyroidism   Hyperlipidemia   CVA (cerebral vascular accident) (HCC)   COPD (chronic obstructive pulmonary disease) (HCC)   Discharged Condition: stable  Hospital Course:  As per the H&P done by Dr. Thomes Dinning: "Kaitlin Shelton is a 87 y.o. female with medical history significant for hypothyroidism, GERD, hyperlipidemia, COPD, prior CVA who presents to [facility] ED via EMS from SNF due to slurred speech.  Patient was unable to provide a history, history was obtained from ED medical record.  Patient had a fall on 05/20/2021, she was not on any anticoagulation.  CT of head done on at the ED showed 8 mm subdural hematoma.  Neurosurgery (Dr. Wynetta Emery) was consulted by ED physician and recommended to repeat CT of head after 6 hours and discharge from ED or to admit overnight and repeat CT of head in the morning.  Patient's family member preferred admitting patient overnight for observation.  She was transferred to [facility] for further evaluation and management.  She denies chest pain, shortness of breath, headache, nausea, vomiting or abdominal pain.   ED Course:  In the emergency department, patient was hemodynamically stable.  Work-up in the ED showed normal CBC and BMP, influenza A, B and SARS coronavirus 2 was negative. Chest x-ray showed cardiomegaly with small left-sided pleural effusion and left basilar airspace disease and hiatal hernia. CT of head without contrast showed Mixed density left convexity subdural hematoma with acute areas of hemorrhage measuring up to 8 mm maximum thickness with small amount of acute left tentorial subdural hematoma.  No significant midline shift and Slight asymmetric hypodensity in the left thalamus compared to previous exam, question age indeterminate infarct. IV hydration was provided.  Hospitalist was consulted and patient was accepted for transfer from [facility]".  Patient was admitted to the hospital for observation.  Repeat CT head without contrast was stable.  Patient has remained stable.  Neurosurgery team was consulted.  Drip team is cleared patient for discharge.  Patient be discharged back to the assisted living facility with home health PT.  Consults:  Neurosurgery  Significant Diagnostic Studies:  Repeat CT head without contrast revealed: 1. Stable left subdural hematoma without worrisome mass effect. 2. 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  Initial CT head revealed: 1. Mixed density left convexity subdural hematoma with acute areas of hemorrhage measuring up to 8 mm maximum thickness with small amount of acute left tentorial subdural hematoma. No significant midline shift. 2. Slight asymmetric hypodensity in the left thalamus compared to previous exam, question age indeterminate infarct. 3. Atrophy and chronic small vessel ischemic changes of the white matter. Multiple left greater than right cerebellar infarcts. Stable positioning of right posterior shunt catheter with stable to slight decreased ventricular size.  Potassium was 3.2.  Discharge Exam: Blood pressure 136/70, pulse 82, temperature 98 F (36.7 C), temperature source Axillary, resp. rate 14, height 5\' 4"  (1.626 m), weight 88 kg, SpO2 96 %.   Disposition: Discharge patient back to assisted living facility with home health PT.  Discharge Instructions     Diet - low sodium heart healthy   Complete by: As directed    Increase activity slowly   Complete by: As directed  Allergies as of 05/27/2021       Reactions   Codeine         Medication List     STOP taking these medications     aspirin EC 81 MG tablet   benzonatate 100 MG capsule Commonly known as: TESSALON   diltiazem 120 MG tablet Commonly known as: CARDIZEM   hydrochlorothiazide 12.5 MG tablet Commonly known as: HYDRODIURIL   omeprazole 20 MG capsule Commonly known as: PRILOSEC   predniSONE 20 MG tablet Commonly known as: DELTASONE       TAKE these medications    albuterol 108 (90 Base) MCG/ACT inhaler Commonly known as: VENTOLIN HFA ProAir HFA 90 mcg/actuation aerosol inhaler   arformoterol 15 MCG/2ML Nebu Commonly known as: BROVANA Brovana 15 mcg/2 mL solution for nebulization  Inhale 2 mL twice a day by inhalation route for 30 days.   diltiazem 180 MG 24 hr capsule Commonly known as: CARDIZEM CD Cartia XT 180 mg capsule,extended release   DULoxetine 30 MG capsule Commonly known as: CYMBALTA Take 30 mg by mouth daily.   folic acid 1 MG tablet Commonly known as: FOLVITE Take 1 mg by mouth daily.   ipratropium-albuterol 0.5-2.5 (3) MG/3ML Soln Commonly known as: DUONEB ipratropium 0.5 mg-albuterol 3 mg (2.5 mg base)/3 mL nebulization soln  USE 1 VIAL IN NEBULIZER EVERY 4 TO 6 HOURS AS NEEDED   levothyroxine 125 MCG tablet Commonly known as: SYNTHROID Take 125 mcg by mouth daily before breakfast.   pravastatin 20 MG tablet Commonly known as: PRAVACHOL Take 20 mg by mouth daily.         SignedBarnetta Chapel 05/27/2021, 2:38 PM

## 2021-05-27 NOTE — NC FL2 (Signed)
[place] MEDICAID FL2 LEVEL OF CARE SCREENING TOOL     IDENTIFICATION  Patient Name: Kaitlin Shelton Birthdate: 11-Jan-1929 Sex: female Admission Date (Current Location): 05/26/2021  [facility] and [place] Number:  Producer, television/film/video and Address:  [facility] [place], [address] [place], [place] [address]      Provider Number: [id number]  Attending Physician Name and Address:  Barnetta Chapel, MD  Relative Name and Phone Number:  Su Grand, [phone]    Current Level of Care: Other (Comment) (ALF, memory care) Recommended Level of Care: Assisted Living Facility Prior Approval Number:    Date Approved/Denied:   PASRR Number:    Discharge Plan: Other (Comment) ([facility])    Current Diagnoses: Patient Active Problem List   Diagnosis Date Noted   Subdural hematoma (HCC) 05/26/2021   Hiatal hernia 05/26/2021   GERD (gastroesophageal reflux disease) 05/26/2021   Pleural effusion on left 05/26/2021   Hypothyroidism 05/26/2021   Hyperlipidemia 05/26/2021   CVA (cerebral vascular accident) (HCC) 05/26/2021   COPD (chronic obstructive pulmonary disease) (HCC) 05/26/2021    Orientation RESPIRATION BLADDER Height & Weight     Self  O2 ([place] 2) Continent, External catheter Weight: 194 lb 0.1 oz (88 kg) Height:  5\' 4"  (162.6 cm)  BEHAVIORAL SYMPTOMS/MOOD NEUROLOGICAL BOWEL NUTRITION STATUS      Continent    AMBULATORY STATUS COMMUNICATION OF NEEDS Skin   Extensive Assist Verbally Skin abrasions (Ecchymosis L and R arm)                       Personal Care Assistance Level of Assistance  Bathing, Feeding, Dressing Bathing Assistance: Limited assistance Feeding assistance: Limited assistance Dressing Assistance: Limited assistance     Functional Limitations Info  Sight, Hearing, Speech Sight Info: Impaired Hearing Info: Adequate Speech Info: Impaired    SPECIAL CARE FACTORS FREQUENCY  PT (By licensed PT), OT (By licensed OT)      PT Frequency: 2x week OT Frequency: 2x week            Contractures Contractures Info: Not present    Additional Factors Info  Code Status, Allergies Code Status Info: DNR Allergies Info: Codeine           Current Medications (05/27/2021):  This is the current hospital active medication list Current Facility-Administered Medications  Medication Dose Route Frequency Provider Last Rate Last Admin   0.9 %  sodium chloride infusion   Intravenous Continuous Adefeso, Ol[place], DO 125 mL/hr at 05/26/21 2102 New Bag at 05/26/21 2102   potassium chloride SA (KLOR-CON) CR tablet 40 mEq  40 mEq Oral Q4H 2103 I, MD   40 mEq at 05/27/21 1319     Discharge Medications: albuterol 108 (90 Base) MCG/ACT inhaler Commonly known as: VENTOLIN HFA ProAir HFA 90 mcg/actuation aerosol inhaler    arformoterol 15 MCG/2ML Nebu Commonly known as: BROVANA Brovana 15 mcg/2 mL solution for nebulization  Inhale 2 mL twice a day by inhalation route for 30 days.    diltiazem 180 MG 24 hr capsule Commonly known as: CARDIZEM CD Cartia XT 180 mg capsule,extended release    DULoxetine 30 MG capsule Commonly known as: CYMBALTA Take 30 mg by mouth daily.    folic acid 1 MG tablet Commonly known as: FOLVITE Take 1 mg by mouth daily.    ipratropium-albuterol 0.5-2.5 (3) MG/3ML Soln Commonly known as: DUONEB ipratropium 0.5 mg-albuterol 3 mg (2.5 mg base)/3 mL nebulization soln  USE 1 VIAL IN NEBULIZER EVERY 4 TO 6 HOURS AS NEEDED    levothyroxine 125 MCG tablet Commonly known as: SYNTHROID Take 125 mcg by mouth daily before breakfast.    pravastatin 20 MG tablet Commonly known as: PRAVACHOL Take 20 mg by mouth daily.       Relevant Imaging Results:  Relevant Lab Results:   Additional Information SS# [id number], [address]

## 2021-05-27 NOTE — Plan of Care (Signed)

## 2021-05-28 NOTE — Progress Notes (Signed)
Patient just left the unit with PTAR to [facility] [place].

## 2021-06-06 ENCOUNTER — Telehealth: Payer: Self-pay | Admitting: Pulmonary Disease

## 2021-06-06 NOTE — Telephone Encounter (Signed)
Spoke to patient's daughter, Kathy(DPR). Olegario Messier is aware of results and voiced her understanding.  she stated that patient is now under hospice care. Hospice has provided oxygen.  Olegario Messier will request that oxygen be increased to 3L. Nothing further needed.

## 2021-06-06 NOTE — Telephone Encounter (Signed)
Overnight oximetry on room air reviewed showing significant desaturations  Patient should continue on oxygen supplementation at night 3 L of oxygen(was on oxygen supplementation as per most recent office visit)

## 2021-09-05 ENCOUNTER — Ambulatory Visit: Payer: Medicare Other | Attending: Internal Medicine

## 2021-09-05 ENCOUNTER — Other Ambulatory Visit (HOSPITAL_BASED_OUTPATIENT_CLINIC_OR_DEPARTMENT_OTHER): Payer: Self-pay

## 2021-09-05 DIAGNOSIS — Z23 Encounter for immunization: Secondary | ICD-10-CM

## 2021-09-05 MED ORDER — MODERNA COVID-19 BIVAL BOOSTER 50 MCG/0.5ML IM SUSP
INTRAMUSCULAR | 0 refills | Status: AC
  Filled 2021-09-05: qty 0.5, 1d supply, fill #0

## 2021-09-05 NOTE — Progress Notes (Signed)
   Covid-19 Vaccination Clinic  Name:  Kaitlin Shelton    MRN: [id number] DOB: 28-Mar-1929  09/05/2021  [name] was observed post Covid-19 immunization for 15 minutes without incident. She was provided with Vaccine Information Sheet and instruction to access the V-Safe system.   [name] was instructed to call 911 with any severe reactions post vaccine: Difficulty breathing  Swelling of face and throat  A fast heartbeat  A bad rash all over body  Dizziness and weakness   Immunizations Administered     Name Date Dose VIS Date Route   Moderna Covid-19 vaccine Bivalent Booster 09/05/2021  5:07 PM 0.5 mL 05/20/2021 Intramuscular   Manufacturer: Moderna   Lot: [id number]   NDC: 08811-031-59

## 2022-09-05 ENCOUNTER — Other Ambulatory Visit: Payer: Self-pay | Admitting: Internal Medicine

## 2022-09-10 LAB — URINALYSIS, ROUTINE W REFLEX MICROSCOPIC

## 2022-09-10 LAB — URINE CULTURE

## 2023-06-09 DEATH — deceased

## 2023-07-08 IMAGING — CT CT HEAD W/O CM
3 of 4 series · 13 of 47 positions shown, 15 images · non-contrast
Comparison: Yesterday

CLINICAL DATA: Follow-up subdural hematoma

EXAM:
CT HEAD WITHOUT CONTRAST
TECHNIQUE: Contiguous axial images were obtained from the base of the skull
through the vertex without intravenous contrast.

[Series 3: head without · axial · non-contrast · 0.47mm/px · z∈[-141,-11]mm · 7 of 36 slices shown, 9 images]
[im 5/36  brain]
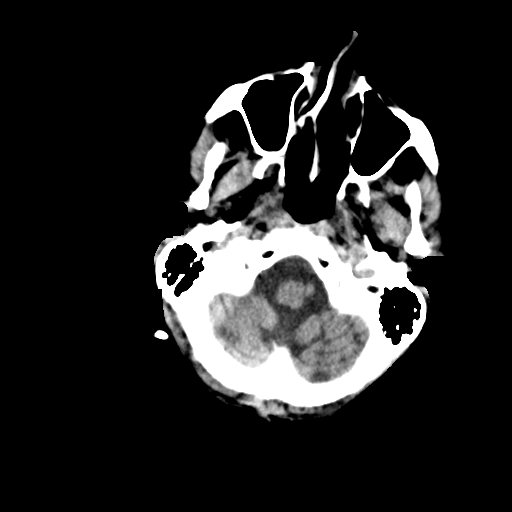
[im 5/36  bone]
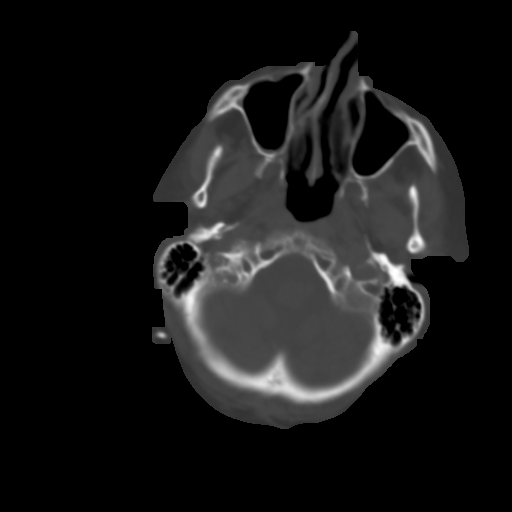
[im 9/36  brain]
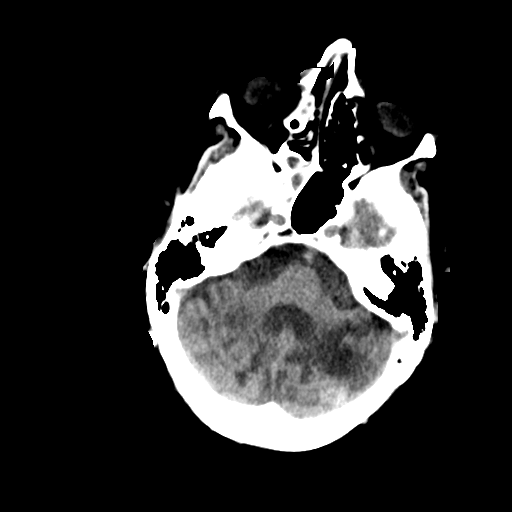
[im 14/36  brain]
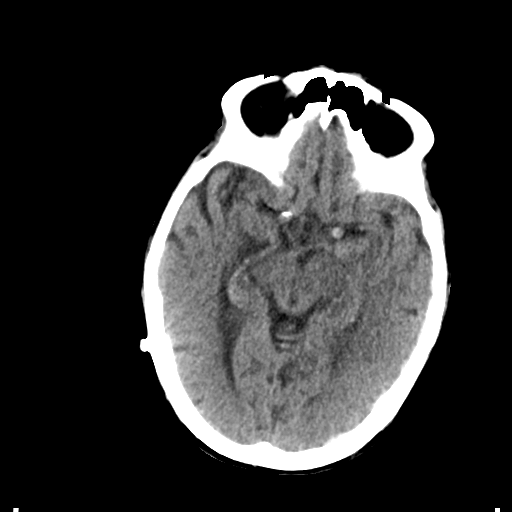
[im 18/36  brain]
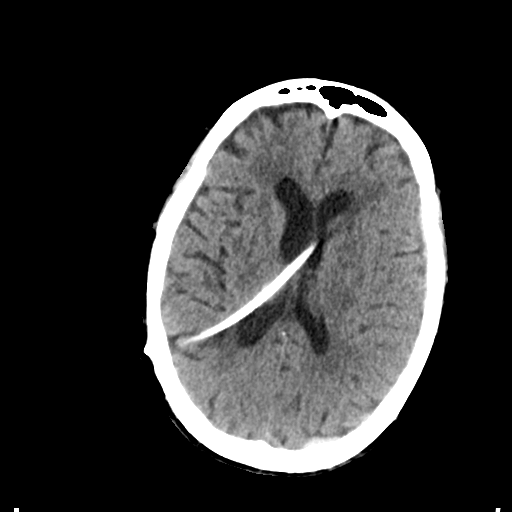
[im 22/36  brain]
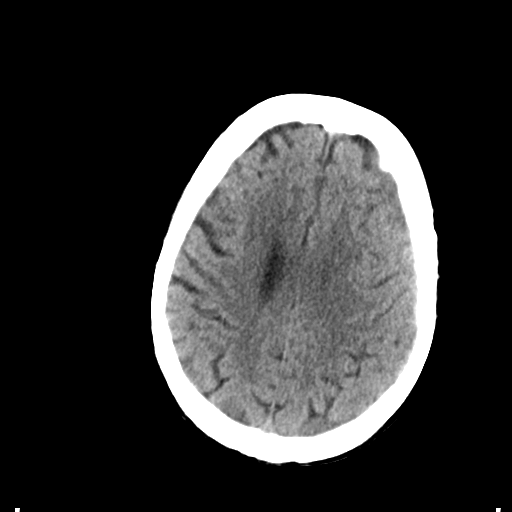
[im 22/36  bone]
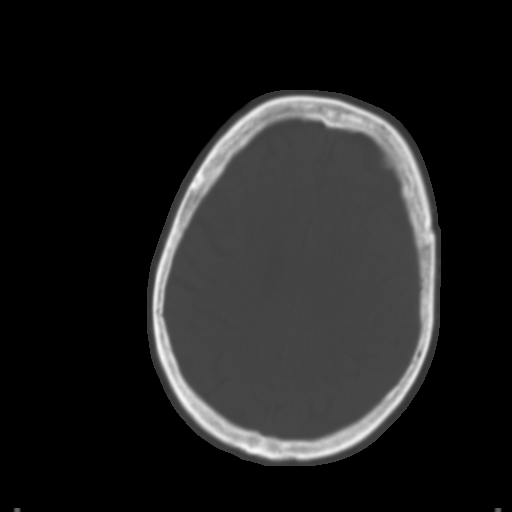
[im 27/36  brain]
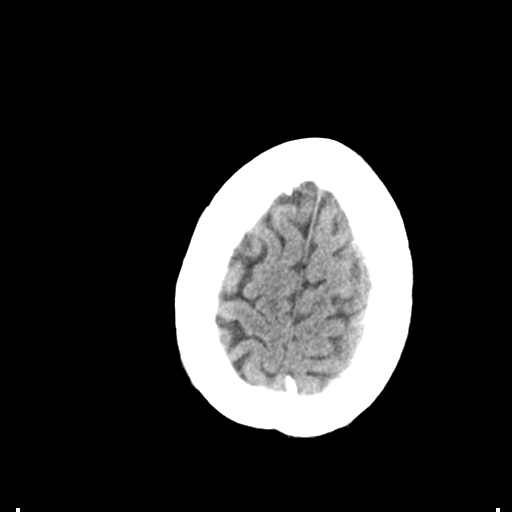
[im 31/36  brain]
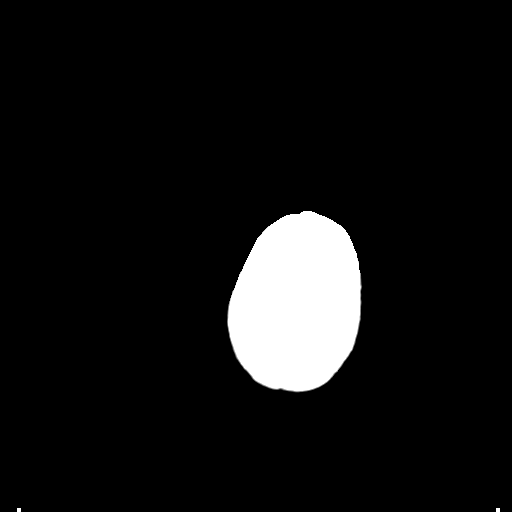

[Series 5: head without cor · coronal · non-contrast · 0.34mm/px · 3 of 67 slices shown]
[im 23/67  brain]
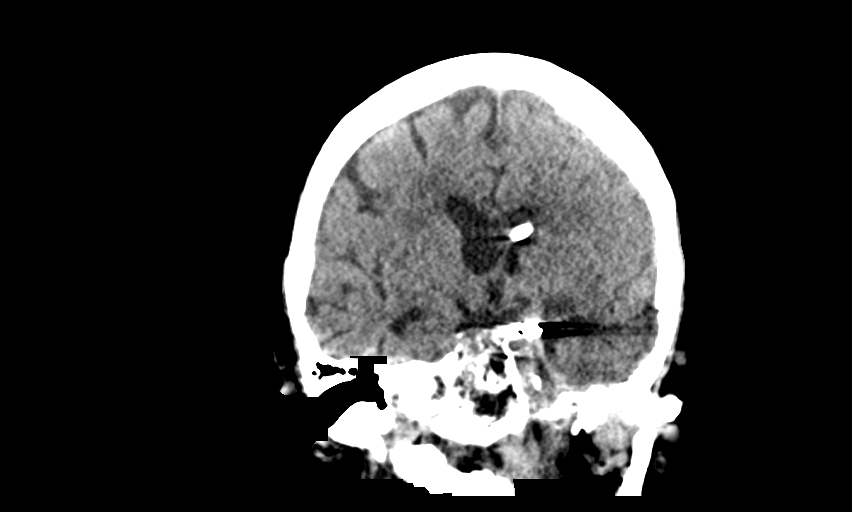
[im 30/67  brain]
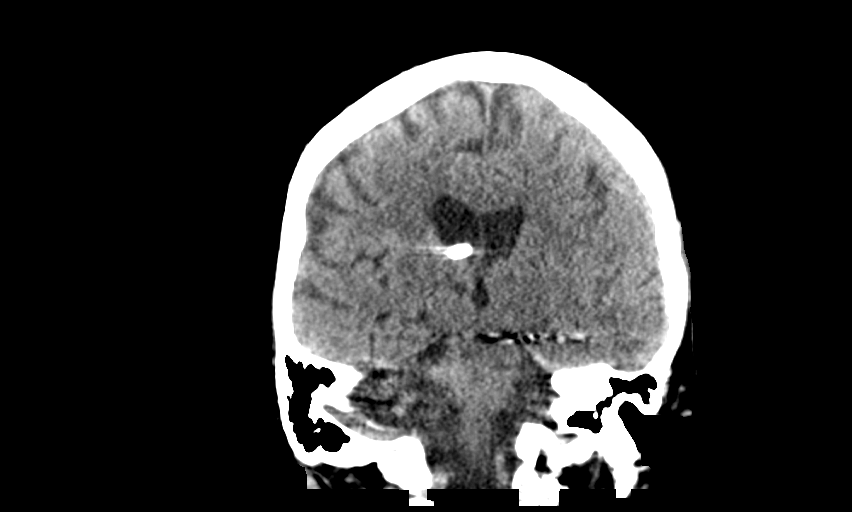
[im 37/67  brain]
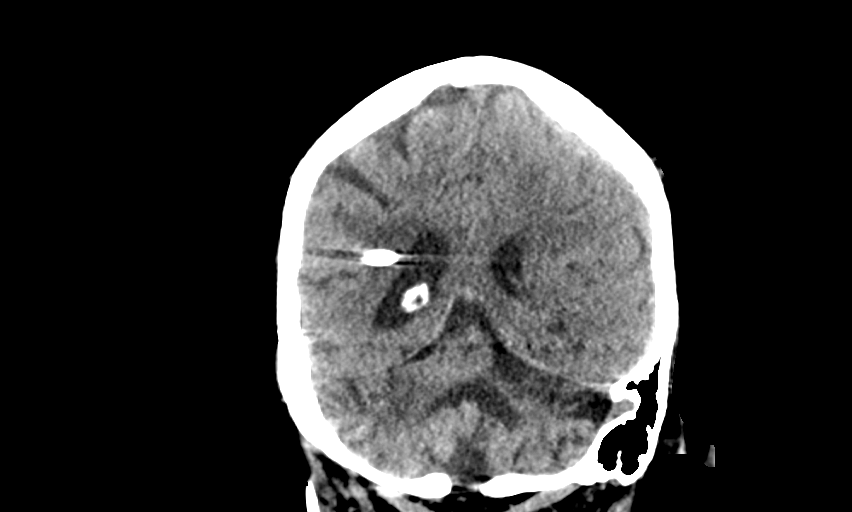

[Series 6: head without sag · sagittal · non-contrast · 0.34mm/px · 3 of 63 slices shown]
[im 21/63  brain]
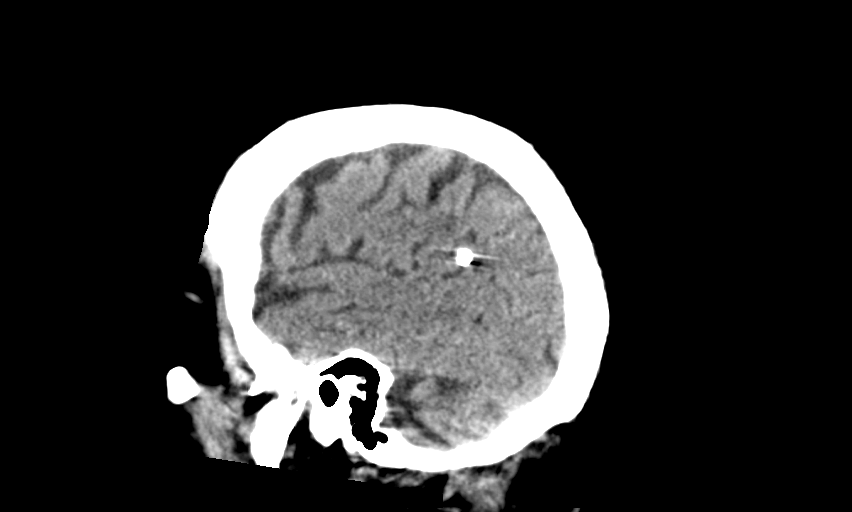
[im 32/63  brain]
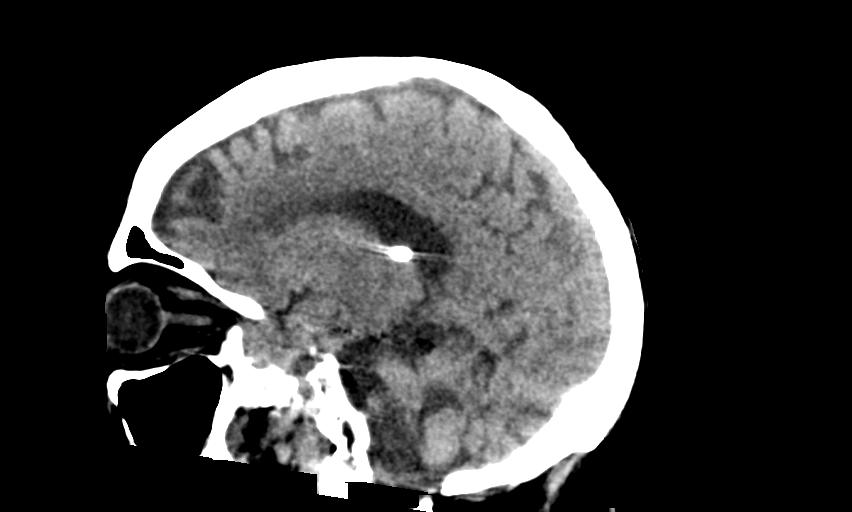
[im 42/63  brain]
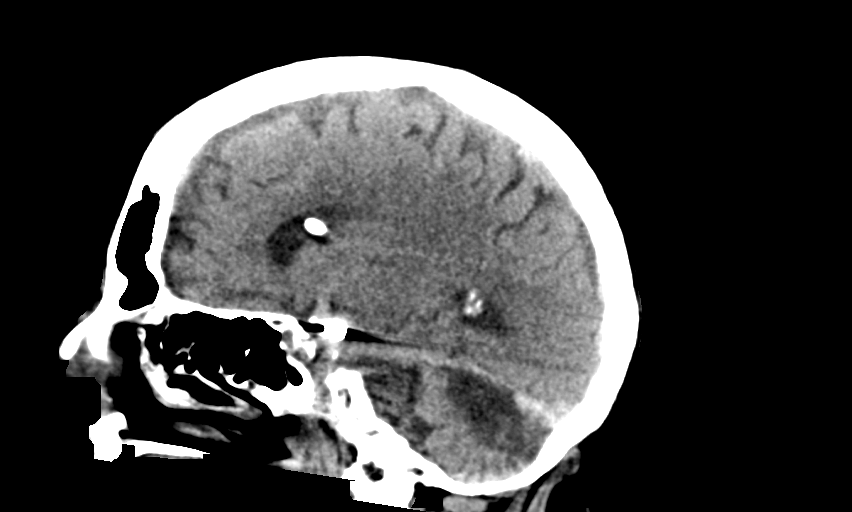

[13 of 47 positions shown; findings below may reference images not displayed]

FINDINGS: Brain: Mainly isodense subdural hematoma along the left cerebral
convexity which measures up to 8 mm in thickness, unchanged. There
is cortical mass effect without significant midline shift.
High-density hematoma along the under surface of the left tentorium
is also stable at up to 7 mm in thickness. Extensive remote
bilateral cerebellar infarction. Chronic bilateral thalamic
infarcts. There has been aneurysm coiling at the left
circle-of-Willis and a right parietal shunt placement with tip at
the left frontal horn of the lateral ventricle. No hydrocephalus.

Vascular: Atheromatous calcifications and left-sided
circle-of-Willis aneurysm coiling.

Skull: Normal. Negative for fracture or focal lesion.

Sinuses/Orbits: No acute finding.
IMPRESSION: 1. Stable left subdural hematoma without worrisome mass effect.
2. Advanced chronic ischemia in the posterior circulation.
